# Patient Record
Sex: Female | Born: 1985 | Race: White | Hispanic: No | Marital: Married
Health system: Midwestern US, Community
[De-identification: ages and names within clinical notes are randomized; demographics above are authoritative.]

## PROBLEM LIST (undated history)

## (undated) DIAGNOSIS — F32A Depression, unspecified: Secondary | ICD-10-CM

## (undated) DIAGNOSIS — F329 Major depressive disorder, single episode, unspecified: Secondary | ICD-10-CM

## (undated) DIAGNOSIS — Z77120 Contact with and (suspected) exposure to mold (toxic): Secondary | ICD-10-CM

## (undated) DIAGNOSIS — F419 Anxiety disorder, unspecified: Secondary | ICD-10-CM

## (undated) DIAGNOSIS — Z124 Encounter for screening for malignant neoplasm of cervix: Secondary | ICD-10-CM

## (undated) DIAGNOSIS — Z0000 Encounter for general adult medical examination without abnormal findings: Secondary | ICD-10-CM

## (undated) HISTORY — DX: Anxiety disorder, unspecified: F41.9

## (undated) HISTORY — PX: WISDOM TOOTH EXTRACTION: SHX21

## (undated) HISTORY — DX: Contact with and (suspected) exposure to mold (toxic): Z77.120

## (undated) HISTORY — DX: Depression, unspecified: F32.A

## (undated) HISTORY — DX: Major depressive disorder, single episode, unspecified: F32.9

## (undated) HISTORY — PX: ACHILLES TENDON REPAIR: SUR1153

---

## 2007-09-09 HISTORY — PX: VULVECTOMY: SHX1086

## 2008-09-08 HISTORY — PX: BREAST FIBROADENOMA SURGERY: SHX580

## 2014-11-20 ENCOUNTER — Ambulatory Visit
Admit: 2014-11-20 | Discharge: 2014-11-20 | Payer: PRIVATE HEALTH INSURANCE | Attending: Family | Primary: Family Medicine

## 2014-11-20 ENCOUNTER — Encounter

## 2014-11-20 DIAGNOSIS — Z0000 Encounter for general adult medical examination without abnormal findings: Secondary | ICD-10-CM

## 2014-11-20 LAB — COMPREHENSIVE METABOLIC PANEL
ALT: 27 U/L (ref 13–61)
AST: 18 U/L (ref 0–31)
Albumin,Serum: 3.8 G/dL (ref 3.4–5.0)
Albumin/Globulin Ratio: 1.5 RATIO (ref 1.0–2.5)
Alkaline Phosphatase: 59 U/L (ref 45–117)
Anion Gap: 9 mmol/L (ref 6–16)
BUN/Creatinine Ratio: 10.5 RATIO (ref 6.0–20.0)
BUN: 9 mG/dL (ref 7–25)
CO2: 27 mmol/L (ref 21–31)
Calcium: 8.8 mG/dL (ref 8.2–10.5)
Chloride: 106 mmol/L (ref 98–109)
Creatinine: 0.86 mG/dL (ref 0.60–1.50)
GFR African American: >60 mL/min/{1.73_m2}
GFR Non-African American: >60 mL/min/{1.73_m2}
Globulin: 2.6 G/dL (ref 2.4–4.1)
Glucose: 84 mG/dL (ref 70–100)
Potassium: 3.6 mmol/L (ref 3.5–5.0)
Sodium: 142 mmol/L (ref 135–145)
Total Bilirubin: 0.5 mG/dL (ref 0.3–1.0)
Total Protein: 6.4 G/dL (ref 6.4–8.3)

## 2014-11-20 LAB — LIPID PANEL
Cholesterol, Total: 129 mG/dL (ref 100–199)
HDL: 47 mG/dL (ref 40–59)
LDL Calculated: 63 mG/dL (ref 0–129)
Triglycerides: 94 mG/dL (ref 40–149)

## 2014-11-20 LAB — TSH: TSH: 1.56 u[IU]/mL (ref 0.358–3.74)

## 2014-11-20 MED ORDER — PAROXETINE HCL 20 MG PO TABS
20 MG | ORAL_TABLET | Freq: Every morning | ORAL | Status: DC
Start: 2014-11-20 — End: 2015-11-21

## 2014-11-20 NOTE — Progress Notes (Signed)
Subjective:      Patient ID: Ellen Vaughn is a 29 y.o. female.    HPI  concerns today- rf paxil, needs letter for work, labs; congestion x3d  bps at home- none  ER visits in the last year- 1/16 cat bite-abx  last labs-2014; sugar nl and chol nl; liver enzyme high  No results found for: CHOL  No results found for: TRIG  No results found for: HDL  No results found for: LDLCALC, LDLCHOLESTEROL  No results found for: LABVLDL, VLDL  No results found for: CHOLHDLRATIO    No results found for: NA, K, CL, CO2, BUN, CREATININE, GLUCOSE, CALCIUM, PROT, LABALBU, BILITOT, ALKPHOS, AST, ALT, LABGLOM, GFRAA, AGRATIO, GLOB    last Pap-dr rice-did not like her; summer 2015    last eye dr appt- summer 2015  last dental appt- 10/15  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        No night sweats. No insomnia.   HENT: Negative for dental problem, hearing loss and trouble swallowing.         Pos seasonal allergic rhinitis symptoms.    Eyes:        No vision changes.   Respiratory: Negative for apnea, chest tightness, shortness of breath and wheezing.         No dyspnea upon exertion.   Cardiovascular: Negative for chest pain, palpitations and leg swelling.        No skips or pauses.   Gastrointestinal: Negative for nausea, vomiting, abdominal pain, diarrhea, constipation and blood in stool.        No heartburn.   Endocrine: Negative for polydipsia, polyphagia and polyuria.   Genitourinary: Negative for dysuria, urgency, frequency, hematuria and difficulty urinating.        No nocturia.   Musculoskeletal: Negative for myalgias and arthralgias.   Skin: Negative for rash and wound.        No lymph node swelling noted.   Neurological: Negative for seizures, syncope, weakness and headaches.        Dizziness at times; has orthostatic hypotension; wears  ted hose to help this   Hematological: Does not bruise/bleed easily.        No epistaxis.    Psychiatric/Behavioral:        No depression, anxiety, suicidal thoughts, or suicidal plans.       Objective:   Physical Exam   Constitutional: She appears well-developed and well-nourished.   HENT:   Head: Normocephalic and atraumatic.   Right Ear: Hearing, tympanic membrane, external ear and ear canal normal.   Left Ear: Hearing, tympanic membrane, external ear and ear canal normal.   Nose: Nose normal.   Mouth/Throat: Uvula is midline, oropharynx is clear and moist and mucous membranes are normal.   Eyes: Conjunctivae and lids are normal. Pupils are equal, round, and reactive to light. Lids are everted and swept, no foreign bodies found.   Fundoscopic exam:       The right eye shows red reflex.        The left eye shows red reflex.   EOMs intact.   Neck: Trachea normal, normal range of motion and phonation normal. Neck supple. Carotid bruit is not present. No thyroid mass and no thyromegaly present.   Cardiovascular:  Normal rate, regular rhythm, normal heart sounds and normal pulses.    No murmur heard.  Pulses:       Radial pulses are 2+ on the right side, and 2+ on the left side.   Pulmonary/Chest: Effort normal and breath sounds normal. She has no decreased breath sounds. She has no wheezes. She has no rhonchi. She has no rales.   Abdominal: Soft. Normal appearance, normal aorta and bowel sounds are normal. There is no splenomegaly or hepatomegaly. There is no tenderness.   BS x 4 quads.   Musculoskeletal: Normal range of motion.   Lymphadenopathy:     She has no cervical adenopathy.   Neurological: She is alert. She has normal strength and normal reflexes.   Reflex Scores:       Patellar reflexes are 2+ on the right side and 2+ on the left side.  CN II-XII intact.   Skin: Skin is warm and intact.   Psychiatric: She has a normal mood and affect. Her speech is normal and behavior is normal. Judgment and thought content  normal. Cognition and memory are normal.       Assessment:      1. Routine adult health maintenance  Comprehensive Metabolic Panel    Lipid Panel    TSH without Reflex             Plan:      Patient Instructions     Exercise-cardio 4-5d/wk each day  Diet-Breakfast-toast (my favorite Terrill [employer] Soft & Smooth)/bagel/English muffin-whole wheat flour as a 1st ingredient or cereal/oatmeal/granola bar-fiber 4g or more; protein like eggs or peanut butter is Ok  Lunch-protein, veg 1c  Dinner-protein, veg 1c  Fruit 2 a day  Dairy 2 a day-milk, soy milk, almond milk, cheese, yogurt, cottage cheese  Snacks-Protein-hard boiled egg, nuts (walnuts/almonds/pecans/pistachios 1/4c), hummus, beef/deer jerky; vegetable, fruit, dairy-milk(1%, skim, almond, soy)/cheese (not a lot of cheddar)/yogurt ([place] is best-my favorite Dannon Fruit on the [employer] Greek)/cottage cheese 2%; triscuits, popcorn have a lot of fiber; serving size  Water  Limit alcohol to 2 drinks per day (1 drink=12oz beer or 5oz wine or 1 1/2oz liquor)  Recommend flu vaccine in the fall  Next visit 1 yr  Well Visit, Ages 74 to 32: Care Instructions  Your Care Instructions  Physical exams can help you stay healthy. Your doctor has checked your overall health and may have suggested ways to take good care of yourself. He or she also may have recommended tests. At home, you can help prevent illness with healthy eating, regular exercise, and other steps.  Follow-up care is a key part of your treatment and safety. Be sure to make and go to all appointments, and call your doctor if you are having problems. It's also a good idea to know your test results and keep a list of the medicines you take.  How can you care for yourself at home?  ?? Reach and stay at a healthy weight. This will lower your risk for many problems, such as obesity, diabetes, heart disease, and high blood pressure.  ?? Get at least 30 minutes of physical activity on most days of the week. Walking is a good  choice. You also may want to do other activities, such as running, swimming, cycling, or playing tennis or team sports. Discuss any changes in your exercise program with your doctor.  ?? Do not smoke or allow others to smoke around you. If you need help quitting, talk to your  doctor about stop-smoking programs and medicines. These can increase your chances of quitting for good.  ?? Talk to your doctor about whether you have any risk factors for sexually transmitted infections (STIs). Having one sex partner (who does not have STIs and does not have sex with anyone else) is a good way to avoid these infections.  ?? Use birth control if you do not want to have children at this time. Talk with your doctor about the choices available and what might be best for you.  ?? Always wear sunscreen on exposed skin. Make sure the sunscreen blocks ultraviolet rays (both UVA and UVB) and has a sun protection factor (SPF) of at least 15. Use it every day, even when it is cloudy. Some doctors may recommend a higher SPF, such as 30.  ?? See a dentist one or two times a year for checkups and to have your teeth cleaned.  ?? Wear a seat belt in the car.  ?? Drink alcohol in moderation, if at all. That means no more than 2 drinks a day for men and 1 drink a day for women.  Follow your doctor's advice about when to have certain tests. These tests can spot problems early.  For everyone  ?? Cholesterol. Have the fat (cholesterol) in your blood tested after age 31. Your doctor will tell you how often to have this done based on your age, family history, or other things that can increase your risk for heart disease.  ?? Blood pressure. Experts suggest that healthy adults with normal blood pressure (119/79 mm Hg or below) have their blood pressure checked at least every 1 to 2 years. This can be done during a routine doctor visit. If you have slightly higher or high blood pressure, your doctor will suggest more frequent tests.  ?? Vision. Talk with your  doctor about how often to have a glaucoma test.  ?? Diabetes. Ask your doctor whether you should have tests for diabetes.  ?? Colon cancer. Have a test for colon cancer at age 31. You may have one of several tests. If you are younger than 4150, you may need a test earlier if you have any risk factors. Risk factors include whether you already had a precancerous polyp removed from your colon or whether your parent, brother, sister, or child has had colon cancer.  For women  ?? Breast exam and mammogram. Talk to your doctor about when you should have a clinical breast exam and a mammogram. Medical experts differ on whether and how often women under 50 should have these tests. Your doctor can help you decide what is right for you.  ?? Pap test and pelvic exam. Begin Pap tests at age 31. A Pap test is the best way to find cervical cancer. The test often is part of a pelvic exam. Ask how often to have this test.  ?? Tests for sexually transmitted infections (STIs). Ask whether you should have tests for STIs. You may be at risk if you have sex with more than one person, especially if your partners do not wear condoms.  For men  ?? Tests for sexually transmitted infections (STIs). Ask whether you should have tests for STIs. You may be at risk if you have sex with more than one person, especially if you do not wear a condom.  ?? Testicular cancer exam. Ask your doctor whether you should check your testicles regularly.  ?? Prostate exam. Talk to your doctor about whether you should  have a blood test (called a PSA test) for prostate cancer. Experts differ on whether and when men should have this test. Some experts suggest it if you are older than 32 and are African-American or have a father or brother who got prostate cancer when he was younger than 56.  When should you call for help?  Watch closely for changes in your health, and be sure to contact your doctor if you have any problems or symptoms that concern you.   Where can you learn  more?   Go to [email or web address] and sign in to your MyChart account. Enter P072 in the Search Health Information box to learn more about ???Well Visit, Ages 65 to 74: Care Instructions.???    If you do not have an account, please click on the ???Sign Up Now??? link.     ?? 2006-2015 [employer]. Care instructions adapted under license by [facility]. This care instruction is for use with your licensed healthcare professional. If you have questions about a medical condition or this instruction, always ask your healthcare professional. [employer], Inc[employer] disclaims any warranty or liability for your use of this information.  Content Version: 10.6.465758; Current as of: October 28, 2013

## 2014-11-20 NOTE — Patient Instructions (Addendum)
Exercise-cardio 4-5d/wk each day  Diet-Breakfast-toast (my favorite Terr[employer] Soft & Smooth)/bagel/English muffin-whole wheat flour as a 1st ingredient or cereal/oatmeal/granola bar-fiber 4g or more; protein like eggs or peanut butter is Ok  Lunch-protein, veg 1c  Dinner-protein, veg 1c  Fruit 2 a day  Dairy 2 a day-milk, soy milk, almond milk, cheese, yogurt, cottage cheese  Snacks-Protein-hard boiled egg, nuts (walnuts/almonds/pecans/pistachios 1/4c), hummus, beef/deer jerky; vegetable, fruit, dairy-milk(1%, skim, almond, soy)/cheese (not a lot of cheddar)/yogurt ([place] is best-my favorite Dannon Fruit on the [employer])/cottage cheese 2%; triscuits, popcorn have a lot of fiber; serving size  Water  Limit alcohol to 2 drinks per day (1 drink=12oz beer or 5oz wine or 1 1/2oz liquor)  Recommend flu vaccine in the fall  Next visit 1 yr  Well Visit, Ages 72 to 52: Care Instructions  Your Care Instructions  Physical exams can help you stay healthy. Your doctor has checked your overall health and may have suggested ways to take good care of yourself. He or she also may have recommended tests. At home, you can help prevent illness with healthy eating, regular exercise, and other steps.  Follow-up care is a key part of your treatment and safety. Be sure to make and go to all appointments, and call your doctor if you are having problems. It's also a good idea to know your test results and keep a list of the medicines you take.  How can you care for yourself at home?  ?? Reach and stay at a healthy weight. This will lower your risk for many problems, such as obesity, diabetes, heart disease, and high blood pressure.  ?? Get at least 30 minutes of physical activity on most days of the week. Walking is a good choice. You also may want to do other activities, such as running, swimming, cycling, or playing tennis or team sports. Discuss any changes in your exercise program with your doctor.  ?? Do not smoke or allow others  to smoke around you. If you need help quitting, talk to your doctor about stop-smoking programs and medicines. These can increase your chances of quitting for good.  ?? Talk to your doctor about whether you have any risk factors for sexually transmitted infections (STIs). Having one sex partner (who does not have STIs and does not have sex with anyone else) is a good way to avoid these infections.  ?? Use birth control if you do not want to have children at this time. Talk with your doctor about the choices available and what might be best for you.  ?? Always wear sunscreen on exposed skin. Make sure the sunscreen blocks ultraviolet rays (both UVA and UVB) and has a sun protection factor (SPF) of at least 15. Use it every day, even when it is cloudy. Some doctors may recommend a higher SPF, such as 30.  ?? See a dentist one or two times a year for checkups and to have your teeth cleaned.  ?? Wear a seat belt in the car.  ?? Drink alcohol in moderation, if at all. That means no more than 2 drinks a day for men and 1 drink a day for women.  Follow your doctor's advice about when to have certain tests. These tests can spot problems early.  For everyone  ?? Cholesterol. Have the fat (cholesterol) in your blood tested after age 20. Your doctor will tell you how often to have this done based on your age, family history, or other things  that can increase your risk for heart disease.  ?? Blood pressure. Experts suggest that healthy adults with normal blood pressure (119/79 mm Hg or below) have their blood pressure checked at least every 1 to 2 years. This can be done during a routine doctor visit. If you have slightly higher or high blood pressure, your doctor will suggest more frequent tests.  ?? Vision. Talk with your doctor about how often to have a glaucoma test.  ?? Diabetes. Ask your doctor whether you should have tests for diabetes.  ?? Colon cancer. Have a test for colon cancer at age 31. You may have one of several tests. If  you are younger than 6750, you may need a test earlier if you have any risk factors. Risk factors include whether you already had a precancerous polyp removed from your colon or whether your parent, brother, sister, or child has had colon cancer.  For women  ?? Breast exam and mammogram. Talk to your doctor about when you should have a clinical breast exam and a mammogram. Medical experts differ on whether and how often women under 50 should have these tests. Your doctor can help you decide what is right for you.  ?? Pap test and pelvic exam. Begin Pap tests at age 31. A Pap test is the best way to find cervical cancer. The test often is part of a pelvic exam. Ask how often to have this test.  ?? Tests for sexually transmitted infections (STIs). Ask whether you should have tests for STIs. You may be at risk if you have sex with more than one person, especially if your partners do not wear condoms.  For men  ?? Tests for sexually transmitted infections (STIs). Ask whether you should have tests for STIs. You may be at risk if you have sex with more than one person, especially if you do not wear a condom.  ?? Testicular cancer exam. Ask your doctor whether you should check your testicles regularly.  ?? Prostate exam. Talk to your doctor about whether you should have a blood test (called a PSA test) for prostate cancer. Experts differ on whether and when men should have this test. Some experts suggest it if you are older than 6045 and are African-American or have a father or brother who got prostate cancer when he was younger than 3465.  When should you call for help?  Watch closely for changes in your health, and be sure to contact your doctor if you have any problems or symptoms that concern you.   Where can you learn more?   Go to [email or web address] and sign in to your MyChart account. Enter P072 in the Search Health Information box to learn more about ???Well Visit, Ages 8318 to 2850: Care Instructions.???    If  you do not have an account, please click on the ???Sign Up Now??? link.     ?? 2006-2015 [employer], [employer]. Care instructions adapted under license by [facility]. This care instruction is for use with your licensed healthcare professional. If you have questions about a medical condition or this instruction, always ask your healthcare professional. [employer] disclaims any warranty or liability for your use of this information.  Content Version: 10.6.465758; Current as of: October 28, 2013

## 2015-11-21 MED ORDER — PAROXETINE HCL 20 MG PO TABS
20 MG | ORAL_TABLET | Freq: Every morning | ORAL | 4 refills | Status: DC
Start: 2015-11-21 — End: 2015-12-25

## 2015-11-21 NOTE — Telephone Encounter (Signed)
Pt advised rx sent

## 2015-11-21 NOTE — Telephone Encounter (Signed)
Pt called states her rx for Paroxetine expired yesterday and she thought it expired Friday 11/23/2015. Pt states she needs the expiration date changed so she can get this medication refilled.     Please advise.

## 2015-11-21 NOTE — Telephone Encounter (Signed)
Script sent

## 2015-12-25 ENCOUNTER — Ambulatory Visit: Admit: 2015-12-25 | Discharge: 2015-12-25 | Payer: BLUE CROSS/BLUE SHIELD | Attending: Family | Primary: Family Medicine

## 2015-12-25 ENCOUNTER — Telehealth

## 2015-12-25 DIAGNOSIS — Z0000 Encounter for general adult medical examination without abnormal findings: Secondary | ICD-10-CM

## 2015-12-25 LAB — CBC WITH AUTO DIFFERENTIAL
Absolute Baso #: 0.1 10*3/uL (ref 0.0–0.2)
Absolute Eos #: 0.1 10*3/uL (ref 0.0–0.5)
Absolute Lymph #: 2 10*3/uL (ref 1.0–4.3)
Absolute Mono #: 0.3 10*3/uL (ref 0.0–0.8)
Absolute Neut #: 1.5 10*3/uL — ABNORMAL LOW (ref 1.8–7.0)
Basophils: 2 %
Eosinophils: 3.4 %
Granulocytes %: 36.4 %
Hematocrit: 38.8 % (ref 35.0–47.0)
Hemoglobin: 12.8 g/dL (ref 11.7–16.0)
Lymphocyte %: 49.8 %
MCH: 28.7 pg (ref 26.0–34.0)
MCHC: 33 % (ref 32.0–36.0)
MCV: 87.1 fL (ref 79.0–98.0)
MPV: 8.4 fL (ref 7.4–10.4)
Monocytes: 8.4 %
Platelets: 217 10*3/uL (ref 140–440)
RBC: 4.46 10*6/uL (ref 3.80–5.20)
RDW: 14.5 % (ref 11.5–14.5)
WBC: 4.1 10*3/uL (ref 3.6–10.7)

## 2015-12-25 LAB — COMPREHENSIVE METABOLIC PANEL
ALT: 26 U/L (ref 12–78)
AST: 23 U/L (ref 15–37)
Albumin,Serum: 3.8 g/dL (ref 3.4–5.0)
Alkaline Phosphatase: 59 U/L (ref 45–117)
Anion Gap: 8 NA
BUN: 11 mg/dL (ref 7–25)
CO2: 28 mmol/L (ref 21–32)
Calcium: 8.8 mg/dL (ref 8.2–10.1)
Chloride: 104 mmol/L (ref 98–109)
Creatinine: 0.8 mg/dL (ref 0.55–1.40)
EGFR IF NonAfrican American: >60 mL/min (ref >60)
Glucose: 84 mg/dL (ref 70–100)
Potassium: 3.7 mmol/L (ref 3.5–5.1)
Sodium: 140 mmol/L (ref 135–145)
Total Bilirubin: 0.5 mg/dL (ref 0.2–1.0)
Total Protein: 6.8 g/dL (ref 6.4–8.2)
eGFR African American: >60 mL/min (ref >60)

## 2015-12-25 LAB — LIPID PANEL
Chol/HDL Ratio: 2 NA
Cholesterol: 155 mg/dL (ref <200)
HDL: 64 mg/dL — ABNORMAL HIGH (ref 40–59)
LDL Cholesterol: 74 mg/dL (ref <100)
Triglycerides: 85 mg/dL (ref <150)

## 2015-12-25 LAB — TSH: TSH: 1.65 uU/mL (ref 0.358–3.740)

## 2015-12-25 MED ORDER — PAROXETINE HCL 20 MG PO TABS
20 | ORAL_TABLET | Freq: Every morning | ORAL | 4 refills | Status: AC
Start: 2015-12-25 — End: ?

## 2015-12-25 NOTE — Telephone Encounter (Signed)
pls call dr rice to get the last pap result  Then send to eileen so when [facility] gets [facility]

## 2015-12-25 NOTE — Telephone Encounter (Signed)
Obtained Pap Smear report and scanned into Epic. Updated HM.

## 2015-12-25 NOTE — Patient Instructions (Addendum)
Exercise-cardio 4-5d/wk 30min each day  Diet-Breakfast-toast (my favorite [employer] Soft [employer] Smooth)/bagel/English muffin-whole wheat flour as a 1st ingredient or cereal/oatmeal/granola bar-fiber 4g or more; protein like eggs or peanut butter is Ok  Lunch-protein, veg 1c  Dinner-protein, veg 1c  Fruit 2 a day  Dairy 2 a day-milk, soy milk, almond milk, cheese, yogurt, cottage cheese  Snacks-Protein-hard boiled egg, nuts (walnuts/almonds/pecans/pistachios 1/4c), hummus, beef/deer jerky; vegetable, fruit, dairy-milk(1%, skim, almond, soy)/cheese (not a lot of cheddar)/yogurt (Greek is best-my favorite D[employer] Fruit on the Bottom Greek)/cottage cheese 2%; triscuits, popcorn have a lot of fiber; serving size  Water  Limit alcohol to 1 drink per day for women and 2 drinks per day for men (1 drink=12oz beer or 5oz wine or 1 1/2oz liquor)  Use sunscreen   Wear seatbelt  Eye dr every 1-2yrs  Dentist every 6-12 mon  Tetanus shot every 10yrs  Recommend flu vaccine in the fall  Appt in 1 year for physical     Well Visit, Ages 18 to 50: Care Instructions  Your Care Instructions  Physical exams can help you stay healthy. Your doctor has checked your overall health and may have suggested ways to take good care of yourself. He or she also may have recommended tests. At home, you can help prevent illness with healthy eating, regular exercise, and other steps.  Follow-up care is a key part of your treatment and safety. Be sure to make and go to all appointments, and call your doctor if you are having problems. It's also a good idea to know your test results and keep a list of the medicines you take.  How can you care for yourself at home?  ?? Reach and stay at a healthy weight. This will lower your risk for many problems, such as obesity, diabetes, heart disease, and high blood pressure.  ?? Get at least 30 minutes of physical activity on most days of the week. Walking is a good choice. You also may want to do other activities, such as  running, swimming, cycling, or playing tennis or team sports. Discuss any changes in your exercise program with your doctor.  ?? Do not smoke or allow others to smoke around you. If you need help quitting, talk to your doctor about stop-smoking programs and medicines. These can increase your chances of quitting for good.  ?? Talk to your doctor about whether you have any risk factors for sexually transmitted infections (STIs). Having one sex partner (who does not have STIs and does not have sex with anyone else) is a good way to avoid these infections.  ?? Use birth control if you do not want to have children at this time. Talk with your doctor about the choices available and what might be best for you.  ?? Protect your skin from too much sun. When you're outdoors from 10 a.m. to 4 p.m., stay in the shade or cover up with clothing and a hat with a wide brim. Wear sunglasses that block UV rays. Even when it's cloudy, put broad-spectrum sunscreen (SPF 30 or higher) on any exposed skin.  ?? See a dentist one or two times a year for checkups and to have your teeth cleaned.  ?? Wear a seat belt in the car.  ?? Drink alcohol in moderation, if at all. That means no more than 2 drinks a day for men and 1 drink a day for women.  Follow your doctor's advice about when to have certain tests. These   tests can spot problems early.  For everyone  ?? Cholesterol. Have the fat (cholesterol) in your blood tested after age 33. Your doctor will tell you how often to have this done based on your age, family history, or other things that can increase your risk for heart disease.  ?? Blood pressure. Have your blood pressure checked during a routine doctor visit. Your doctor will tell you how often to check your blood pressure based on your age, your blood pressure results, and other factors.  ?? Vision. Talk with your doctor about how often to have a glaucoma test.  ?? Diabetes. Ask your doctor whether you should have tests for diabetes.  ?? Colon  cancer. Have a test for colon cancer at age 66. You may have one of several tests. If you are younger than 61, you may need a test earlier if you have any risk factors. Risk factors include whether you already had a precancerous polyp removed from your colon or whether your parent, brother, sister, or child has had colon cancer.  For women  ?? Breast exam and mammogram. Talk to your doctor about when you should have a clinical breast exam and a mammogram. Medical experts differ on whether and how often women under 50 should have these tests. Your doctor can help you decide what is right for you.  ?? Pap test and pelvic exam. Begin Pap tests at age 84. A Pap test is the best way to find cervical cancer. The test often is part of a pelvic exam. Ask how often to have this test.  ?? Tests for sexually transmitted infections (STIs). Ask whether you should have tests for STIs. You may be at risk if you have sex with more than one person, especially if your partners do not wear condoms.  For men  ?? Tests for sexually transmitted infections (STIs). Ask whether you should have tests for STIs. You may be at risk if you have sex with more than one person, especially if you do not wear a condom.  ?? Testicular cancer exam. Ask your doctor whether you should check your testicles regularly.  ?? Prostate exam. Talk to your doctor about whether you should have a blood test (called a PSA test) for prostate cancer. Experts differ on whether and when men should have this test. Some experts suggest it if you are older than 53 and are African-American or have a father or brother who got prostate cancer when he was younger than 79.  When should you call for help?  Watch closely for changes in your health, and be sure to contact your doctor if you have any problems or symptoms that concern you.  Where can you learn more?  Go to [email or web address] and sign in to your MyChart account. Enter P072 in the Search Health  Information box to learn more about "Well Visit, Ages 49 to 53: Care Instructions."     If you do not have an account, please click on the "Sign Up Now" link.  Current as of: March 27, 2015  Content Version: 11.2  ?? 2006-2017 [employer]. Care instructions adapted under license by [facility]. If you have questions about a medical condition or this instruction, always ask your healthcare professional. [employer] disclaims any warranty or liability for your use of this information.       Learning About Living Patrecia Pace  What is a living will?  A living will is a legal form you use to write down the  kind of care you want at the end of your life. It is used by the health professionals who will treat you if you aren't able to decide for yourself.  If you put your wishes in writing, your loved ones and others will know what kind of care you want. They won't need to guess. This can ease your mind and be helpful to others.  A living will is not the same as an estate or property will. An estate will explains what you want to happen with your money and property after you die.  Is a living will a legal document?  A living will is a legal document. Each state has its own laws about living wills. If you move to another state, make sure that your living will is legal in the state where you now live. Or you might use a universal form that has been approved by many states. This kind of form can sometimes be completed and stored online. Your electronic copy will then be available wherever you have a connection to the Internet. In most cases, doctors will respect your wishes even if you have a form from a different state.  ?? You don't need an attorney to complete a living will. But legal advice can be helpful if your state's laws are unclear, your health history is complicated, or your family can't agree on what should be in your living will.  ?? You can change your living will at any time. Some people find  that their wishes about end-of-life care change as their health changes.  ?? In addition to making a living will, think about completing a medical power of attorney form. This form lets you name the person you want to make end-of-life treatment decisions for you (your "health care agent") if you're not able to. Many hospitals and nursing homes will give you the forms you need to complete a living will and a medical power of attorney.  ?? Your living will is used only if you can't make or communicate decisions for yourself anymore. If you become able to make decisions again, you can accept or refuse any treatment, no matter what you wrote in your living will.  ?? Your state may offer an online registry. This is a place where you can store your living will online so the doctors and nurses who need to treat you can find it right away.  What should you think about when creating a living will?  Talk about your end-of-life wishes with your family members and your doctor. Let them know what you want. That way the people making decisions for you won't be surprised by your choices.  Think about these questions as you make your living will:  ?? Do you know enough about life support methods that might be used? If not, talk to your doctor so you know what might be done if you can't breathe on your own, your heart stops, or you're unable to swallow.  ?? What things would you still want to be able to do after you receive life-support methods? Would you want to be able to walk? To speak? To eat on your own? To live without the help of machines?  ?? If you have a choice, where do you want to be cared for? In your home? At a hospital or nursing home?  ?? Do you want certain religious practices performed if you become very ill?  ?? If you have a choice at the end of your  life, where would you prefer to die? At home? In a hospital or nursing home? Somewhere else?  ?? Would you prefer to be buried or cremated?  ?? Do you want your organs to be  donated after you die?  What should you do with your living will?  ?? Make sure that your family members and your health care agent have copies of your living will.  ?? Give your doctor a copy of your living will to keep in your medical record. If you have more than one doctor, make sure that each one has a copy.  ?? You may want to put a copy of your living will where it can be easily found.  Where can you learn more?  Go to [email or web address] and sign in to your MyChart account. Enter [phone] in the Search Health Information box to learn more about "Learning About Living Patrecia PaceWills."     If you do not have an account, please click on the "Sign Up Now" link.  Current as of: November 01, 2014  Content Version: 11.2  ?? 2006-2017 [employer]. Care instructions adapted under license by [facility]. If you have questions about a medical condition or this instruction, always ask your healthcare professional. [employer] disclaims any warranty or liability for your use of this information.

## 2015-12-25 NOTE — Progress Notes (Signed)
Subjective:      Patient ID: Ellen Vaughn is a 30 y.o. female.    HPI  concerns today- none  bps at home- none  ER visits in the last year- none  last labs-  Lab Results   Component Value Date    CHOL 129 11/20/2014     Lab Results   Component Value Date    TRIG 94 11/20/2014     Lab Results   Component Value Date    HDL 47 11/20/2014     Lab Results   Component Value Date    LDLCALC 63 11/20/2014     No results found for: LABVLDL, VLDL  No results found for: [facility]    Lab Results   Component Value Date    NA 142 11/20/2014    K 3.6 11/20/2014    CL 106 11/20/2014    CO2 27 11/20/2014    BUN 9 11/20/2014    CREATININE 0.86 11/20/2014    GLUCOSE 84 11/20/2014    CALCIUM 8.8 11/20/2014    PROT 6.4 11/20/2014    LABALBU 3.8 11/20/2014    BILITOT 0.5 11/20/2014    ALKPHOS 59 11/20/2014    AST 18 11/20/2014    ALT 27 11/20/2014    LABGLOM >60 11/20/2014    GFRAA >60 11/20/2014    AGRATIO 1.5 11/20/2014    GLOB 2.6 11/20/2014       Special[place]    last Pap-1 1/2 yrs ago    last eye dr appt- summer 2016  last dental appt- 4/17  Exercise- daily-walk 2 mi a day and strengthening  Diet- eating more veg fruit; eat 5-6 small meals  Lost 8lbs d/t walking dog; pt denies trying to lose wt    Immunization History   Administered Date(s) Administered   ??? Tdap (Boostrix, Adacel) 11/19/2012       Review of Systems   Constitutional: Negative for appetite change, chills, fatigue, fever and unexpected weight change.        Well developed, well nourished. Does not appear ill. No distress. No night sweats. No insomnia.   HENT: Negative for congestion, dental problem, ear discharge, ear pain, hearing loss, nosebleeds, sore throat, tinnitus and trouble swallowing.         No seasonal allergic rhinitis symptoms.    Eyes: Negative for pain, discharge and visual disturbance.        No vision changes.   Respiratory: Negative for apnea, cough, chest tightness, shortness of breath and wheezing.         No dyspnea upon  exertion.   Cardiovascular: Negative for chest pain, palpitations and leg swelling.        No skips or pauses.   Gastrointestinal: Negative for abdominal distention, abdominal pain, blood in stool, constipation, diarrhea, nausea and vomiting.        No heartburn.   Endocrine: Negative for polydipsia, polyphagia and polyuria.   Genitourinary: Negative for difficulty urinating, dysuria, frequency, hematuria and urgency.        No nocturia.   Musculoskeletal: Negative for arthralgias and myalgias.   Skin: Negative for rash and wound.        No lymph node swelling noted.   Neurological: Positive for dizziness (wears ted hose to help with that). Negative for seizures, syncope, weakness, light-headedness, numbness and headaches.   Hematological: Does not bruise/bleed easily.        No epistaxis.    Psychiatric/Behavioral:        No  anxiety, suicidal thoughts, or suicidal plans. paxil working well for depression       Objective:   Physical Exam   Constitutional: She appears well-developed and well-nourished. She does not appear ill. No distress.   HENT:   Head: Normocephalic and atraumatic.   Right Ear: Hearing, tympanic membrane, external ear and ear canal normal.   Left Ear: Hearing, tympanic membrane, external ear and ear canal normal.   Nose: Nose normal.   Mouth/Throat: Uvula is midline, oropharynx is clear and moist and mucous membranes are normal.   Eyes: Conjunctivae, EOM and lids are normal. Pupils are equal, round, and reactive to light.   Fundoscopic exam:       The right eye shows red reflex.        The left eye shows red reflex.   Neck: Trachea normal, normal range of motion and phonation normal. Neck supple. Carotid bruit is not present. No thyroid mass and no thyromegaly present.   Cardiovascular: Normal rate, regular rhythm, S1 normal, S2 normal, normal heart sounds and normal pulses.  Exam reveals no gallop and no friction rub.    No murmur heard.  Pulses:       Radial pulses are 2+ on the right side, and  2+ on the left side.   Pulmonary/Chest: Effort normal and breath sounds normal. No accessory muscle usage. No respiratory distress. She has no decreased breath sounds. She has no wheezes. She has no rhonchi. She has no rales.   Abdominal: Soft. Normal appearance, normal aorta and bowel sounds are normal. There is no splenomegaly or hepatomegaly. There is no tenderness. There is no rigidity, no rebound and no guarding.   BS x 4 quads.   Musculoskeletal: Normal range of motion.   Lymphadenopathy:     She has no cervical adenopathy.   Neurological: She is alert. She has normal strength and normal reflexes. Coordination and gait normal.   Reflex Scores:       Patellar reflexes are 2+ on the right side and 2+ on the left side.  CN II-XII intact.   Skin: Skin is warm and intact. No rash noted.   Psychiatric: She has a normal mood and affect. Her speech is normal and behavior is normal. Judgment and thought content normal. Cognition and memory are normal.   Vitals reviewed.      Assessment:      1. Routine adult health maintenance  Comprehensive Metabolic Panel    Lipid Panel    TSH without Reflex    CBC Auto Differential           Plan:      Patient Instructions   Exercise-cardio 4-5d/wk each day  Diet-Breakfast-toast (my favorite Terrill Mohr Soft & Smooth)/bagel/English muffin-whole wheat flour as a 1st ingredient or cereal/oatmeal/granola bar-fiber 4g or more; protein like eggs or peanut butter is Ok  Lunch-protein, veg 1c  Dinner-protein, veg 1c  Fruit 2 a day  Dairy 2 a day-milk, soy milk, almond milk, cheese, yogurt, cottage cheese  Snacks-Protein-hard boiled egg, nuts (walnuts/almonds/pecans/pistachios 1/4c), hummus, beef/deer jerky; vegetable, fruit, dairy-milk(1%, skim, almond, soy)/cheese (not a lot of cheddar)/yogurt ([place] is best-my favorite Dannon Fruit on the [employer] Greek)/cottage cheese 2%; triscuits, popcorn have a lot of fiber; serving size  Water  Limit alcohol to 1 drink per day for women and 2  drinks per day for men (1 drink=12oz beer or 5oz wine or 1 1/2oz liquor)  Use sunscreen   Wear seatbelt  Eye dr every  1-40yrs  Dentist every 6-12 mon  Tetanus shot every 25yrs  Recommend flu vaccine in the fall  Appt in 1 year for physical     Well Visit, Ages 38 to 4: Care Instructions  Your Care Instructions  Physical exams can help you stay healthy. Your doctor has checked your overall health and may have suggested ways to take good care of yourself. He or she also may have recommended tests. At home, you can help prevent illness with healthy eating, regular exercise, and other steps.  Follow-up care is a key part of your treatment and safety. Be sure to make and go to all appointments, and call your doctor if you are having problems. It's also a good idea to know your test results and keep a list of the medicines you take.  How can you care for yourself at home?  ?? Reach and stay at a healthy weight. This will lower your risk for many problems, such as obesity, diabetes, heart disease, and high blood pressure.  ?? Get at least 30 minutes of physical activity on most days of the week. Walking is a good choice. You also may want to do other activities, such as running, swimming, cycling, or playing tennis or team sports. Discuss any changes in your exercise program with your doctor.  ?? Do not smoke or allow others to smoke around you. If you need help quitting, talk to your doctor about stop-smoking programs and medicines. These can increase your chances of quitting for good.  ?? Talk to your doctor about whether you have any risk factors for sexually transmitted infections (STIs). Having one sex partner (who does not have STIs and does not have sex with anyone else) is a good way to avoid these infections.  ?? Use birth control if you do not want to have children at this time. Talk with your doctor about the choices available and what might be best for you.  ?? Protect your skin from too much sun. When you're  outdoors from 10 a.m. to 4 p.m., stay in the shade or cover up with clothing and a hat with a wide brim. Wear sunglasses that block UV rays. Even when it's cloudy, put broad-spectrum sunscreen (SPF 30 or higher) on any exposed skin.  ?? See a dentist one or two times a year for checkups and to have your teeth cleaned.  ?? Wear a seat belt in the car.  ?? Drink alcohol in moderation, if at all. That means no more than 2 drinks a day for men and 1 drink a day for women.  Follow your doctor's advice about when to have certain tests. These tests can spot problems early.  For everyone  ?? Cholesterol. Have the fat (cholesterol) in your blood tested after age 24. Your doctor will tell you how often to have this done based on your age, family history, or other things that can increase your risk for heart disease.  ?? Blood pressure. Have your blood pressure checked during a routine doctor visit. Your doctor will tell you how often to check your blood pressure based on your age, your blood pressure results, and other factors.  ?? Vision. Talk with your doctor about how often to have a glaucoma test.  ?? Diabetes. Ask your doctor whether you should have tests for diabetes.  ?? Colon cancer. Have a test for colon cancer at age 75. You may have one of several tests. If you are younger than 84, you may  need a test earlier if you have any risk factors. Risk factors include whether you already had a precancerous polyp removed from your colon or whether your parent, brother, sister, or child has had colon cancer.  For women  ?? Breast exam and mammogram. Talk to your doctor about when you should have a clinical breast exam and a mammogram. Medical experts differ on whether and how often women under 50 should have these tests. Your doctor can help you decide what is right for you.  ?? Pap test and pelvic exam. Begin Pap tests at age 32. A Pap test is the best way to find cervical cancer. The test often is part of a pelvic exam. Ask how often  to have this test.  ?? Tests for sexually transmitted infections (STIs). Ask whether you should have tests for STIs. You may be at risk if you have sex with more than one person, especially if your partners do not wear condoms.  For men  ?? Tests for sexually transmitted infections (STIs). Ask whether you should have tests for STIs. You may be at risk if you have sex with more than one person, especially if you do not wear a condom.  ?? Testicular cancer exam. Ask your doctor whether you should check your testicles regularly.  ?? Prostate exam. Talk to your doctor about whether you should have a blood test (called a PSA test) for prostate cancer. Experts differ on whether and when men should have this test. Some experts suggest it if you are older than 6945 and are African-American or have a father or brother who got prostate cancer when he was younger than 6765.  When should you call for help?  Watch closely for changes in your health, and be sure to contact your doctor if you have any problems or symptoms that concern you.  Where can you learn more?  Go to [email or web address] and sign in to your MyChart account. Enter P072 in the Search Health Information box to learn more about "Well Visit, Ages 5318 to 6750: Care Instructions."     If you do not have an account, please click on the "Sign Up Now" link.  Current as of: March 27, 2015  Content Version: 11.2  ?? 2006-2017 [employer]. Care instructions adapted under license by [facility]. If you have questions about a medical condition or this instruction, always ask your healthcare professional. [employer], Inc[employer] disclaims any warranty or liability for your use of this information.       Learning About Living Ellen Vaughn  What is a living will?  A living will is a legal form you use to write down the kind of care you want at the end of your life. It is used by the health professionals who will treat you if you aren't able to decide for  yourself.  If you put your wishes in writing, your loved ones and others will know what kind of care you want. They won't need to guess. This can ease your mind and be helpful to others.  A living will is not the same as an estate or property will. An estate will explains what you want to happen with your money and property after you die.  Is a living will a legal document?  A living will is a legal document. Each state has its own laws about living wills. If you move to another state, make sure that your living will is legal in the state where you now live.  Or you might use a universal form that has been approved by many states. This kind of form can sometimes be completed and stored online. Your electronic copy will then be available wherever you have a connection to the Internet. In most cases, doctors will respect your wishes even if you have a form from a different state.  ?? You don't need an attorney to complete a living will. But legal advice can be helpful if your state's laws are unclear, your health history is complicated, or your family can't agree on what should be in your living will.  ?? You can change your living will at any time. Some people find that their wishes about end-of-life care change as their health changes.  ?? In addition to making a living will, think about completing a medical power of attorney form. This form lets you name the person you want to make end-of-life treatment decisions for you (your "health care agent") if you're not able to. Many hospitals and nursing homes will give you the forms you need to complete a living will and a medical power of attorney.  ?? Your living will is used only if you can't make or communicate decisions for yourself anymore. If you become able to make decisions again, you can accept or refuse any treatment, no matter what you wrote in your living will.  ?? Your state may offer an online registry. This is a place where you can store your living will online  so the doctors and nurses who need to treat you can find it right away.  What should you think about when creating a living will?  Talk about your end-of-life wishes with your family members and your doctor. Let them know what you want. That way the people making decisions for you won't be surprised by your choices.  Think about these questions as you make your living will:  ?? Do you know enough about life support methods that might be used? If not, talk to your doctor so you know what might be done if you can't breathe on your own, your heart stops, or you're unable to swallow.  ?? What things would you still want to be able to do after you receive life-support methods? Would you want to be able to walk? To speak? To eat on your own? To live without the help of machines?  ?? If you have a choice, where do you want to be cared for? In your home? At a hospital or nursing home?  ?? Do you want certain religious practices performed if you become very ill?  ?? If you have a choice at the end of your life, where would you prefer to die? At home? In a hospital or nursing home? Somewhere else?  ?? Would you prefer to be buried or cremated?  ?? Do you want your organs to be donated after you die?  What should you do with your living will?  ?? Make sure that your family members and your health care agent have copies of your living will.  ?? Give your doctor a copy of your living will to keep in your medical record. If you have more than one doctor, make sure that each one has a copy.  ?? You may want to put a copy of your living will where it can be easily found.  Where can you learn more?  Go to [email or web address] and sign in to your MyChart account. Enter [phone] in the Search Health Information  box to learn more about "Learning About Living Ellen Pace."     If you do not have an account, please click on the "Sign Up Now" link.  Current as of: November 01, 2014  Content Version: 11.2  ?? 2006-2017 [employer]  [employer]. Care instructions adapted under license by [facility]. If you have questions about a medical condition or this instruction, always ask your healthcare professional. [employer], Inc[employer] disclaims any warranty or liability for your use of this information.

## 2016-06-11 NOTE — Telephone Encounter (Signed)
Done.

## 2017-04-16 DIAGNOSIS — F419 Anxiety disorder, unspecified: Secondary | ICD-10-CM | POA: Insufficient documentation

## 2018-08-03 ENCOUNTER — Ambulatory Visit (INDEPENDENT_AMBULATORY_CARE_PROVIDER_SITE_OTHER): Payer: BLUE CROSS/BLUE SHIELD | Admitting: Advanced Practice Midwife

## 2018-08-03 ENCOUNTER — Encounter: Payer: Self-pay | Admitting: Advanced Practice Midwife

## 2018-08-03 VITALS — BP 115/62 | HR 68 | Resp 16 | Ht 68.0 in | Wt 136.0 lb

## 2018-08-03 DIAGNOSIS — Z1151 Encounter for screening for human papillomavirus (HPV): Secondary | ICD-10-CM

## 2018-08-03 DIAGNOSIS — Z124 Encounter for screening for malignant neoplasm of cervix: Secondary | ICD-10-CM

## 2018-08-03 DIAGNOSIS — Z01419 Encounter for gynecological examination (general) (routine) without abnormal findings: Secondary | ICD-10-CM | POA: Diagnosis not present

## 2018-08-03 NOTE — Progress Notes (Signed)
Subjective:     Jody Schroeder is a 32 y.o. female here for a routine exam.  Current complaints: none.  Personal health questionnaire reviewed: yes. Pt desires annual Pap.   Gynecologic History Patient's last menstrual period was 07/20/2018. Contraception: none Last Pap: 2018. Results were: normal Last mammogram: n/a.   Obstetric History OB History  Gravida Para Term Preterm AB Living  0 0 0 0 0 0  SAB TAB Ectopic Multiple Live Births  0 0 0 0 0     The following portions of the patient's history were reviewed and updated as appropriate: allergies, current medications, past family history, past medical history, past social history, past surgical history and problem list.  Review of Systems A comprehensive review of systems was negative.    Objective:   BP 115/62   Pulse 68   Resp 16   Ht 5\' 8"  (1.727 m)   Wt 61.7 kg   LMP 07/20/2018   BMI 20.68 kg/m    VS reviewed, nursing note reviewed,  Constitutional: well developed, well nourished, no distress HEENT: normocephalic CV: normal rate Pulm/chest wall: normal effort Breast Exam:  right breast normal without mass, skin or nipple changes or axillary nodes, left breast normal without mass, skin or nipple changes or axillary nodes Abdomen: soft Neuro: alert and oriented x 3 Skin: warm, dry Psych: affect normal Pelvic exam: Cervix pink, visually closed, without lesion, scant white creamy discharge, vaginal walls and external genitalia normal Bimanual exam: Cervix 0/long/high, firm, anterior, neg CMT, uterus nontender, nonenlarged, adnexa without tenderness, enlargement, or mass   Assessment/Plan   1. Well woman exam with routine gynecological exam  - Cytology - PAP( [place])    Education reviewed: self breast exams and weight bearing exercise. Follow up in: 1 year.     Sharen CounterLisa Leftwich-Kirby, CNM 1:26 PM

## 2018-08-04 LAB — CYTOLOGY - PAP
DIAGNOSIS: NEGATIVE
HPV: NOT DETECTED

## 2019-05-18 DIAGNOSIS — Z6820 Body mass index (BMI) 20.0-20.9, adult: Secondary | ICD-10-CM | POA: Diagnosis not present

## 2019-05-18 DIAGNOSIS — Z0000 Encounter for general adult medical examination without abnormal findings: Secondary | ICD-10-CM | POA: Diagnosis not present

## 2019-06-14 ENCOUNTER — Other Ambulatory Visit: Payer: Self-pay

## 2019-06-14 ENCOUNTER — Encounter: Payer: Self-pay | Admitting: Advanced Practice Midwife

## 2019-06-14 ENCOUNTER — Ambulatory Visit (INDEPENDENT_AMBULATORY_CARE_PROVIDER_SITE_OTHER): Payer: BC Managed Care – PPO | Admitting: Advanced Practice Midwife

## 2019-06-14 VITALS — BP 107/71 | HR 77 | Resp 16 | Ht 68.0 in | Wt 138.0 lb

## 2019-06-14 DIAGNOSIS — Z124 Encounter for screening for malignant neoplasm of cervix: Secondary | ICD-10-CM

## 2019-06-14 DIAGNOSIS — Z1151 Encounter for screening for human papillomavirus (HPV): Secondary | ICD-10-CM

## 2019-06-14 DIAGNOSIS — Z01419 Encounter for gynecological examination (general) (routine) without abnormal findings: Secondary | ICD-10-CM | POA: Diagnosis not present

## 2019-06-14 DIAGNOSIS — F525 Vaginismus not due to a substance or known physiological condition: Secondary | ICD-10-CM | POA: Insufficient documentation

## 2019-06-14 NOTE — Progress Notes (Signed)
Subjective:     Jody Schroeder is a 33 y.o. female here for a routine exam.  Current complaints: pt is not having intercourse due to pain.  She had vulvectomy due to pain 10+ years ago and has significant pain with intercourse now. She prefers to do Pap every year. Paps have been normal. Personal health questionnaire reviewed: yes.   Do you have a primary care provider? yes How many times per week do you exercise? 3+ Do you feel safe at home? Yes Has anyone hit, slapped, or kicked you recently? No Do you feel sad, tired, or upset most days or are you mostly happy with life? Mostly happy  Gynecologic History Patient's last menstrual period was 06/02/2019. Contraception: none Last Pap: 2019. Results were: normal Last mammogram: n/a.   Obstetric History OB History  Gravida Para Term Preterm AB Living  0 0 0 0 0 0  SAB TAB Ectopic Multiple Live Births  0 0 0 0 0     The following portions of the patient's history were reviewed and updated as appropriate: allergies, current medications, past family history, past medical history, past social history, past surgical history and problem list.  Review of Systems Pertinent items noted in HPI and remainder of comprehensive ROS otherwise negative.    Objective:  BP 107/71   Pulse 77   Resp 16   Ht 5\' 8"  (1.727 m)   Wt 138 lb (62.6 kg)   LMP 06/02/2019   BMI 20.98 kg/m    VS reviewed, nursing note reviewed,  Constitutional: well developed, well nourished, no distress HEENT: normocephalic CV: normal rate Pulm/chest wall: normal effort Breast Exam:  right breast normal without mass, skin or nipple changes or axillary nodes, left breast normal without mass, skin or nipple changes or axillary nodes Abdomen: soft Neuro: alert and oriented x 3 Skin: warm, dry Psych: affect normal Pelvic exam: Cervix pink, visually closed, without lesion, scant white creamy discharge, vaginal walls and external genitalia normal.  Pt with pain on insertion  of the speculum, resolved with relaxation, change to smaller Pederson speculum. Bimanual exam: Cervix 0/long/high, firm, anterior, neg CMT, uterus nontender, nonenlarged, adnexa without tenderness, enlargement, or mass      Assessment/Plan:   1. Well woman exam with routine gynecological exam --Doing well overall.  Not sexually active with her husband due to vaginal pain, this is a chronic issue (see vaginismus below).  2. Encounter for screening for cervical cancer  - Cytology - PAP( [place])  3. 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  Discussed options with pt and she would like to research and try some pelvic floor exercises at home.  She used to work as Forensic scientist.  Encouraged pt to follow up with [place] for more treatment as needed.      Social/emotional concerns:  None, likely mild anxiety but managed well.  Contraception: none  Follow up in: 1 year or as needed.   Fatima Blank, CNM 10:06 AM

## 2019-06-21 LAB — CYTOLOGY - PAP
Diagnosis: NEGATIVE
High risk HPV: NEGATIVE

## 2019-09-12 DIAGNOSIS — E7800 Pure hypercholesterolemia, unspecified: Secondary | ICD-10-CM | POA: Diagnosis not present

## 2019-09-12 DIAGNOSIS — F419 Anxiety disorder, unspecified: Secondary | ICD-10-CM | POA: Diagnosis not present

## 2019-09-12 DIAGNOSIS — Z0000 Encounter for general adult medical examination without abnormal findings: Secondary | ICD-10-CM | POA: Diagnosis not present

## 2019-12-19 DIAGNOSIS — D2272 Melanocytic nevi of left lower limb, including hip: Secondary | ICD-10-CM | POA: Diagnosis not present

## 2019-12-19 DIAGNOSIS — D225 Melanocytic nevi of trunk: Secondary | ICD-10-CM | POA: Diagnosis not present

## 2019-12-19 DIAGNOSIS — L700 Acne vulgaris: Secondary | ICD-10-CM | POA: Diagnosis not present

## 2019-12-19 DIAGNOSIS — Z1283 Encounter for screening for malignant neoplasm of skin: Secondary | ICD-10-CM | POA: Diagnosis not present

## 2020-02-13 DIAGNOSIS — N390 Urinary tract infection, site not specified: Secondary | ICD-10-CM | POA: Diagnosis not present

## 2020-02-13 DIAGNOSIS — R5383 Other fatigue: Secondary | ICD-10-CM | POA: Diagnosis not present

## 2020-02-13 DIAGNOSIS — R531 Weakness: Secondary | ICD-10-CM | POA: Diagnosis not present

## 2020-02-13 DIAGNOSIS — R42 Dizziness and giddiness: Secondary | ICD-10-CM | POA: Diagnosis not present

## 2020-02-13 DIAGNOSIS — R319 Hematuria, unspecified: Secondary | ICD-10-CM | POA: Diagnosis not present

## 2020-02-28 ENCOUNTER — Emergency Department (INDEPENDENT_AMBULATORY_CARE_PROVIDER_SITE_OTHER): Payer: BC Managed Care – PPO

## 2020-02-28 ENCOUNTER — Other Ambulatory Visit: Payer: Self-pay

## 2020-02-28 ENCOUNTER — Emergency Department (INDEPENDENT_AMBULATORY_CARE_PROVIDER_SITE_OTHER)
Admission: RE | Admit: 2020-02-28 | Discharge: 2020-02-28 | Disposition: A | Discharge status: Home / Self Care | Payer: BC Managed Care – PPO | Source: Ambulatory Visit | Attending: Family Medicine | Admitting: Family Medicine

## 2020-02-28 VITALS — BP 119/88 | HR 81 | Temp 98.5°F | Ht 68.0 in | Wt 133.0 lb

## 2020-02-28 DIAGNOSIS — R1013 Epigastric pain: Secondary | ICD-10-CM | POA: Diagnosis not present

## 2020-02-28 DIAGNOSIS — R0781 Pleurodynia: Secondary | ICD-10-CM

## 2020-02-28 DIAGNOSIS — M940 Chondrocostal junction syndrome [Tietze]: Secondary | ICD-10-CM

## 2020-02-28 LAB — POCT URINALYSIS DIP (MANUAL ENTRY)
Bilirubin, UA: NEGATIVE
Blood, UA: NEGATIVE
Glucose, UA: NEGATIVE mg/dL
Ketones, POC UA: NEGATIVE mg/dL
Leukocytes, UA: NEGATIVE
Nitrite, UA: NEGATIVE
Protein Ur, POC: NEGATIVE mg/dL
Spec Grav, UA: 1.015 (ref 1.010–1.025)
Urobilinogen, UA: 0.2 E.U./dL
pH, UA: 6 (ref 5.0–8.0)

## 2020-02-28 NOTE — Discharge Instructions (Addendum)
Apply ice pack for 20 to 30 minutes, 3 to 4 times daily  Continue until pain and swelling decrease.  May take Ibuprofen 200mg, 4 tabs every 8 hours with food.  

## 2020-02-28 NOTE — ED Triage Notes (Signed)
Epigastric pain x 2 weeks lifted heavy rocks on May 8th has had uti since which has not resolved with Cipro.

## 2020-02-28 NOTE — ED Provider Notes (Signed)
[facility]    CSN: [id number] Arrival date & time: 02/28/20  0950      History   Chief Complaint Chief Complaint  Patient presents with  . Abdominal Pain    HPI Jody Schroeder is a 34 y.o. female.   Patient complains of persistent epigastric and right flank pain after lifting very heavy stones in May.  On 02/13/20 she was treated for a UTI with Cipro.  After culture results returned she was switched to amoxicillin for 10 days for more appropriate bacterial coverage.  She is concerned that her pain may represent persistent UTI.  She feels well otherwise without urinary symptoms or fevers, chills, and sweats.   The history is provided by the patient.    Past Medical History:  Diagnosis Date  . Anxiety   . Depression     Patient Active Problem List   Diagnosis Date Noted  . 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 ACHILLES TENDON REPAIR    . BREAST FIBROADENOMA SURGERY Left 2010  . VULVECTOMY  2009  . WISDOM TOOTH EXTRACTION      OB History    Gravida  0   Para  0   Term  0   Preterm  0   AB  0   Living  0     SAB  0   TAB  0   Ectopic  0   Multiple  0   Live Births  0            Home Medications    Prior to Admission medications   Medication Sig Start Date End Date Taking? Authorizing Provider  buPROPion (WELLBUTRIN XL) 150 MG 24 hr tablet Take by mouth. 06/03/19   [provider]  Multiple Vitamin (MULTIVITAMIN) tablet Take 1 tablet by mouth daily.    [provider]    Family History Family History  Problem Relation Age of Onset  . COPD Paternal Grandfather   . Diabetes Paternal Grandmother   . Hypertension Paternal Grandmother   . Thyroid cancer Maternal Grandfather   . Heart disease Maternal Grandfather   . Hypertension Father   . Diabetes Father   . Depression Mother   . Fibroids Mother   . Ovarian cysts Sister      Social History Social History   Tobacco Use  . Smoking status: Never Smoker  . Smokeless tobacco: Never Used  Vaping Use  . Vaping Use: Never used  Substance Use Topics  . Alcohol use: Never  . Drug use: Never     Allergies   Patient has no known allergies.   Review of Systems Review of Systems  Constitutional: Negative for chills, diaphoresis, fatigue and fever.  HENT: Negative.   Eyes: Negative.   Respiratory: Negative for cough, chest tightness, shortness of breath, wheezing and stridor.   Cardiovascular: Positive for chest pain. Negative for leg swelling.  Gastrointestinal: Negative.   Genitourinary: Positive for flank pain. Negative for dysuria, frequency, hematuria and urgency.  Musculoskeletal:       Persistent right lower/lateral rib pain  Skin: Negative for rash.  Neurological: Negative.      Physical Exam Triage Vital Signs ED Triage Vitals  Enc Vitals Group     BP 02/28/20 1037 119/88     Pulse Rate 02/28/20 1037 81     Resp --      Temp 02/28/20 1037 98.5 F (36.9  C)     Temp Source 02/28/20 1037 Oral     SpO2 02/28/20 1037 98 %     Weight 02/28/20 1039 133 lb (60.3 kg)     Height 02/28/20 1039 5\' 8"  (1.727 m)     Head Circumference --      Peak Flow --      Pain Score 02/28/20 1038 7     Pain Loc --      Pain Edu? --      Excl. in GC? --    No data found.  Updated Vital Signs BP 119/88 (BP Location: Right Arm)   Pulse 81   Temp 98.5 F (36.9 C) (Oral)   Ht 5\' 8"  (1.727 m)   Wt 60.3 kg   LMP 02/05/2020 (Approximate)   SpO2 98%   BMI 20.22 kg/m   Visual Acuity Right Eye Distance:   Left Eye Distance:   Bilateral Distance:    Right Eye Near:   Left Eye Near:    Bilateral Near:     Physical Exam Vitals and nursing note reviewed.  Constitutional:      General: She is not in acute distress. HENT:     Head: Normocephalic.     Right Ear: External ear normal.     Left Ear: External ear normal.     Mouth/Throat:     Mouth:  Mucous membranes are moist.  Eyes:     Conjunctiva/sclera: Conjunctivae normal.     Pupils: Pupils are equal, round, and reactive to light.  Cardiovascular:     Heart sounds: Normal heart sounds.  Pulmonary:     Breath sounds: Normal breath sounds. No wheezing, rhonchi or rales.       Comments: Tenderness to palpation over the right posterior/inferior ribs as noted on diagram.  Chest:     Chest wall: Tenderness present.       Comments: There is tenderness to palpation over the Xiphoid process extending along right inferior ribs and costal margin as noted on diagram.  Abdominal:     Hernia: No hernia is present.  Musculoskeletal:        General: No tenderness.     Cervical back: Neck supple.     Right lower leg: No edema.     Left lower leg: No edema.  Lymphadenopathy:     Cervical: No cervical adenopathy.  Skin:    General: Skin is warm and dry.     Findings: No rash.  Neurological:     Mental Status: She is alert and oriented to person, place, and time.      UC Treatments / Results  Labs (all labs ordered are listed, but only abnormal results are displayed) Labs Reviewed  URINE CULTURE    POCT urinalysis dipstick (new) Order: Status:  Final result Visible to patient:  No (inaccessible in MyChart) Next appt:  None Dx:  Abdominal pain, epigastric  0 Result Notes  Ref Range & Units 4 d ago  Color, UA yellow yellow   Clarity, UA clear clear   Glucose, UA negative mg/dL negative   Bilirubin, UA negative negative   Ketones, POC UA negative mg/dL negative   Spec Grav, UA 1.010 - 1.025 1.015   Blood, UA negative negative   pH, UA 5.0 - 8.0 6.0   Protein Ur, POC negative mg/dL negative   Urobilinogen, UA 0.2 or 1.0 E.U./dL 0.2   Nitrite, UA Negative Negative   Leukocytes, UA Negative Negative   Resulting Agency  [facility]         EKG   Radiology CLINICAL DATA:  Right-sided rib pain following heavy lifting 2 months ago, initial  encounter  EXAM: RIGHT RIBS AND CHEST - 3+ VIEW  COMPARISON:  None.  FINDINGS: Cardiac shadow is within normal limits. The lungs are clear bilaterally. No pneumothorax or effusion is seen. No rib fractures are noted.  IMPRESSION: No acute rib fracture is noted.   Electronically Signed   By: Alcide Clever M.D.   On: 02/28/2020 12:44  Procedures Procedures (including critical care time)  Medications Ordered in UC Medications - No data to display  Initial Impression / Assessment and Plan / UC Course  I have reviewed the triage vital signs and the nursing notes.  Pertinent labs & imaging results that were available during my care of the patient were reviewed by me and considered in my medical decision making (see chart for details).    Patient reassurance:  No evidence persistent UTI; will send urine culture at patient's request. If patient's rib pain does not improve with conservative treatment, consider follow-up with Dr. Charyl Bigger [facility]) for consideration of local corticosteroid injection.   Final Clinical Impressions(s) / UC Diagnoses   Final diagnoses:  Abdominal pain, epigastric  Costochondritis     Discharge Instructions     Apply ice pack for 20 to 30 minutes, 3 to 4 times daily  Continue until pain and swelling decrease.  May take Ibuprofen 200mg , 4 tabs every 8 hours with food.      ED Prescriptions    None        , MD 03/03/20 [phone]

## 2020-03-01 LAB — URINE CULTURE
MICRO NUMBER:: 10622395
Result:: NO GROWTH
SPECIMEN QUALITY:: ADEQUATE

## 2020-04-30 ENCOUNTER — Telehealth: Payer: Self-pay | Admitting: Emergency Medicine

## 2020-04-30 ENCOUNTER — Other Ambulatory Visit: Payer: Self-pay

## 2020-04-30 ENCOUNTER — Emergency Department (INDEPENDENT_AMBULATORY_CARE_PROVIDER_SITE_OTHER)
Admission: RE | Admit: 2020-04-30 | Discharge: 2020-04-30 | Disposition: A | Discharge status: Home / Self Care | Payer: BC Managed Care – PPO | Source: Ambulatory Visit

## 2020-04-30 ENCOUNTER — Other Ambulatory Visit (HOSPITAL_COMMUNITY)
Admission: RE | Admit: 2020-04-30 | Discharge: 2020-04-30 | Disposition: A | Discharge status: Home / Self Care | Payer: BC Managed Care – PPO | Source: Ambulatory Visit | Attending: Family Medicine | Admitting: Family Medicine

## 2020-04-30 VITALS — BP 111/75 | HR 76 | Temp 98.2°F | Ht 68.0 in | Wt 133.0 lb

## 2020-04-30 DIAGNOSIS — N898 Other specified noninflammatory disorders of vagina: Secondary | ICD-10-CM

## 2020-04-30 DIAGNOSIS — Z8619 Personal history of other infectious and parasitic diseases: Secondary | ICD-10-CM | POA: Diagnosis not present

## 2020-04-30 DIAGNOSIS — Z9189 Other specified personal risk factors, not elsewhere classified: Secondary | ICD-10-CM | POA: Diagnosis not present

## 2020-04-30 DIAGNOSIS — R6883 Chills (without fever): Secondary | ICD-10-CM

## 2020-04-30 DIAGNOSIS — B49 Unspecified mycosis: Secondary | ICD-10-CM | POA: Diagnosis not present

## 2020-04-30 DIAGNOSIS — R52 Pain, unspecified: Secondary | ICD-10-CM

## 2020-04-30 DIAGNOSIS — R531 Weakness: Secondary | ICD-10-CM | POA: Diagnosis not present

## 2020-04-30 DIAGNOSIS — K146 Glossodynia: Secondary | ICD-10-CM

## 2020-04-30 DIAGNOSIS — Z8742 Personal history of other diseases of the female genital tract: Secondary | ICD-10-CM | POA: Insufficient documentation

## 2020-04-30 MED ORDER — FLUCONAZOLE 200 MG PO TABS: 200.0000 mg | ORAL_TABLET | Freq: Every day | ORAL | 1 refills | Status: AC

## 2020-04-30 NOTE — Telephone Encounter (Signed)
Call to [facility] to confirm tubes to be sent to lab to R/O throat fungal infection . Lab rep confirmed - throat culture in white culture tube could be used. Order throat culture (code 394 does not work, but per [employer] okay to use Strep throat culture to test for fungal infection.) Red top &  white culture top sent to [facility]. Provider updated

## 2020-04-30 NOTE — ED Triage Notes (Addendum)
Thrush, body aches, hot and cold, fatigue, weakness x 4 days all stemming from ABT for UTI in May Unvaccinated

## 2020-04-30 NOTE — Discharge Instructions (Addendum)
You may take 500mg  acetaminophen every 4-6 hours or in combination with ibuprofen 400-600mg  every 6-8 hours as needed for pain, inflammation, and fever.  Be sure to well hydrated with clear liquids and get at least 8 hours of sleep at night, preferably more while sick.    Call to schedule an appointment with a primary care provider to establish care for recheck of ongoing symptoms and further evaluation of recurrent yeast infections.    Call 911 or have someone drive you to the hospital if symptoms worsening- dizziness/passing out, chest pain, trouble breathing, unable to keep down fluids, or other new concerning symptoms develop.   Due to concern for possibly having Covid-19, it is advised that you self-isolate at home until test results come back, usually 2-3 days.  If positive, it is recommended you stay isolated for at least 10 days after symptom onset and 24 hours after last fever without taking medication (whichever is longer).  If you MUST go out, please wear a mask at all times, limit contact with others.   If your test is negative, you still have plenty of time to get the Covid vaccine. It is recommended you schedule an appointment to get your vaccine once you get over this current illness.  Please ask your primary care provider about any questions/concerns related to the vaccine.

## 2020-04-30 NOTE — ED Provider Notes (Signed)
[facility]    CSN: [id number] Arrival date & time: 04/30/20  0858      History   Chief Complaint Chief Complaint  Patient presents with  . Thrush    HPI Jody Schroeder is a 34 y.o. female.   HPI  Jody Schroeder is a 33 y.o. female presenting to UC with c/o body aches, hot and cold chills, fatigue and generalized weakness the last few days but states she is most concerned about recurrent oral thrush and yeast infections since being prescribed several rounds of antibiotics in June 2021 for a UTI.  Pt's last dose of Diflucan was in July, which helped only temporarily.  Pt concerned about continued recurrent tongue irritation and white film on her tongue.  Denies hx of diabetes, HIV, or other known autoimmune disease.  Denies use of oral steroids or recent use of antibiotics.  Pt had a PCP in June but has since been seeing a holistic provider and did Telemedicine in July for the last round of diflucan. Denies URI symptoms.  Pt has not received Covid-19 vaccine.     Past Medical History:  Diagnosis Date  . Anxiety   . Depression     Patient Active Problem List   Diagnosis Date Noted  . 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 ACHILLES TENDON REPAIR    . BREAST FIBROADENOMA SURGERY Left 2010  . VULVECTOMY  2009  . WISDOM TOOTH EXTRACTION      OB History    Gravida  0   Para  0   Term  0   Preterm  0   AB  0   Living  0     SAB  0   TAB  0   Ectopic  0   Multiple  0   Live Births  0            Home Medications    Prior to Admission medications   Medication Sig Start Date End Date Taking? Authorizing Provider  buPROPion (WELLBUTRIN XL) 150 MG 24 hr tablet Take by mouth. 06/03/19   [provider]  Multiple Vitamin (MULTIVITAMIN) tablet Take 1 tablet by mouth daily.    [provider]    Family History Family History  Problem Relation Age of  Onset  . COPD Paternal Grandfather   . Diabetes Paternal Grandmother   . Hypertension Paternal Grandmother   . Thyroid cancer Maternal Grandfather   . Heart disease Maternal Grandfather   . Hypertension Father   . Diabetes Father   . Depression Mother   . Fibroids Mother   . Ovarian cysts Sister     Social History Social History   Tobacco Use  . Smoking status: Never Smoker  . Smokeless tobacco: Never Used  Vaping Use  . Vaping Use: Never used  Substance Use Topics  . Alcohol use: Never  . Drug use: Never     Allergies   Patient has no known allergies.   Review of Systems Review of Systems  Constitutional: Positive for chills, fatigue and fever (subjective).  HENT: Positive for dental problem (tongue pain/irritation/film). Negative for congestion, ear pain, sore throat, trouble swallowing and voice change.   Respiratory: Negative for cough and shortness of breath.   Cardiovascular: Negative for chest pain and palpitations.  Gastrointestinal: Negative for abdominal pain, diarrhea, nausea and vomiting.  Musculoskeletal: Negative for arthralgias, back pain and myalgias.  Skin:  Negative for rash.  Neurological: Positive for weakness (generalized). Negative for dizziness, syncope, light-headedness, numbness and headaches.  All other systems reviewed and are negative.    Physical Exam Triage Vital Signs ED Triage Vitals  Enc Vitals Group     BP 04/30/20 0917 111/75     Pulse Rate 04/30/20 0917 76     Resp --      Temp 04/30/20 0917 98.2 F (36.8 C)     Temp Source 04/30/20 0917 Oral     SpO2 04/30/20 0917 99 %     Weight 04/30/20 0919 133 lb (60.3 kg)     Height 04/30/20 0919 5\' 8"  (1.727 m)     Head Circumference --      Peak Flow --      Pain Score 04/30/20 0918 2     Pain Loc --      Pain Edu? --      Excl. in GC? --    No data found.  Updated Vital Signs BP 111/75 (BP Location: Right Arm)   Pulse 76   Temp 98.2 F (36.8 C) (Oral)   Ht 5\' 8"  (1.727  m)   Wt 133 lb (60.3 kg)   LMP 04/23/2020 (Approximate)   SpO2 99%   BMI 20.22 kg/m   Visual Acuity Right Eye Distance:   Left Eye Distance:   Bilateral Distance:    Right Eye Near:   Left Eye Near:    Bilateral Near:     Physical Exam Vitals and nursing note reviewed.  Constitutional:      General: She is not in acute distress.    Appearance: Normal appearance. She is well-developed. She is not ill-appearing, toxic-appearing or diaphoretic.  HENT:     Head: Normocephalic and atraumatic.     Right Ear: Tympanic membrane and ear canal normal.     Left Ear: Tympanic membrane and ear canal normal.     Nose: Nose normal.     Right Sinus: No maxillary sinus tenderness or frontal sinus tenderness.     Left Sinus: No maxillary sinus tenderness or frontal sinus tenderness.     Mouth/Throat:     Lips: Pink.     Mouth: Mucous membranes are moist. No oral lesions.     Tongue: No lesions.     Pharynx: Oropharynx is clear. Uvula midline. No pharyngeal swelling, oropharyngeal exudate, posterior oropharyngeal erythema or uvula swelling.     Tonsils: No tonsillar exudate or tonsillar abscesses.  Cardiovascular:     Rate and Rhythm: Normal rate and regular rhythm.  Pulmonary:     Effort: Pulmonary effort is normal. No respiratory distress.     Breath sounds: Normal breath sounds. No stridor. No wheezing, rhonchi or rales.  Musculoskeletal:        General: Normal range of motion.     Cervical back: Normal range of motion and neck supple. No tenderness.  Lymphadenopathy:     Cervical: No cervical adenopathy.  Skin:    General: Skin is warm and dry.  Neurological:     Mental Status: She is alert and oriented to person, place, and time.  Psychiatric:        Behavior: Behavior normal.      UC Treatments / Results  Labs (all labs ordered are listed, but only abnormal results are displayed) Labs Reviewed  NOVEL CORONAVIRUS, NAA   Narrative:    Performed at:  [address], [place], [address]  [place] Lab Director:  Jolene Schimke MD, Phone:  [phone]  SARS-COV-2, NAA 2 DAY TAT   Narrative:    Performed at:  [address] [place] [address] [place], [place]  [id number] Lab Director: Jolene Schimke MD, Phone:  [phone]  FUNGAL STAIN  KOH PREP  CERVICOVAGINAL ANCILLARY ONLY    EKG   Radiology No results found.  Procedures Procedures (including critical care time)  Medications Ordered in UC Medications - No data to display  Initial Impression / Assessment and Plan / UC Course  I have reviewed the triage vital signs and the nursing notes.  Pertinent labs & imaging results that were available during my care of the patient were reviewed by me and considered in my medical decision making (see chart for details).     Pt c/o recurrent fungal infections. No obvious lesions c/w thrush on today's exam Fungal swab sent to lab as well as vaginal swab for yeast.  Pt started on diflucan while test pending.  Systemic symptoms of chills, fatigue weakness during pandemic and unvaccinated, concern for Covid.   Encouraged to reestablish are with a PCP for further evaluation and treatment of recurrent symptoms AVS given  Final Clinical Impressions(s) / UC Diagnoses   Final diagnoses:  Body aches  Chills (without fever)  Weakness  At increased risk of exposure to COVID-19 virus  Tongue pain  Vaginal irritation     Discharge Instructions     You may take 500mg  acetaminophen every 4-6 hours or in combination with ibuprofen 400-600mg  every 6-8 hours as needed for pain, inflammation, and fever.  Be sure to well hydrated with clear liquids and get at least 8 hours of sleep at night, preferably more while sick.    Call to schedule an appointment with a primary care provider to establish care for recheck of ongoing symptoms and further evaluation of recurrent yeast infections.    Call 911 or have someone drive you to  the hospital if symptoms worsening- dizziness/passing out, chest pain, trouble breathing, unable to keep down fluids, or other new concerning symptoms develop.   Due to concern for possibly having Covid-19, it is advised that you self-isolate at home until test results come back, usually 2-3 days.  If positive, it is recommended you stay isolated for at least 10 days after symptom onset and 24 hours after last fever without taking medication (whichever is longer).  If you MUST go out, please wear a mask at all times, limit contact with others.   If your test is negative, you still have plenty of time to get the Covid vaccine. It is recommended you schedule an appointment to get your vaccine once you get over this current illness.  Please ask your primary care provider about any questions/concerns related to the vaccine.      ED Prescriptions    Medication Sig Dispense Auth. Provider   fluconazole (DIFLUCAN) 200 MG tablet Take 1 tablet (200 mg total) by mouth daily for 1 day. 1 tablet , PA-C     PDMP not reviewed this encounter.   Lurene Shadow, Lurene Shadow 05/02/20 2133

## 2020-05-01 LAB — CERVICOVAGINAL ANCILLARY ONLY
Bacterial Vaginitis (gardnerella): NEGATIVE
Candida Glabrata: NEGATIVE
Candida Vaginitis: NEGATIVE
Comment: NEGATIVE
Comment: NEGATIVE
Comment: NEGATIVE

## 2020-05-02 LAB — FUNGAL STAIN
FUNGAL SMEAR:: NONE SEEN
MICRO NUMBER:: 10861889
SPECIMEN QUALITY:: ADEQUATE

## 2020-05-02 LAB — SARS-COV-2, NAA 2 DAY TAT

## 2020-05-02 LAB — CULTURE, GROUP A STREP
MICRO NUMBER:: 10861876
SPECIMEN QUALITY:: ADEQUATE

## 2020-05-02 LAB — NOVEL CORONAVIRUS, NAA: SARS-CoV-2, NAA: NOT DETECTED

## 2020-06-19 ENCOUNTER — Ambulatory Visit: Payer: BC Managed Care – PPO | Admitting: Advanced Practice Midwife

## 2020-07-03 ENCOUNTER — Encounter: Payer: Self-pay | Admitting: Advanced Practice Midwife

## 2020-07-03 ENCOUNTER — Ambulatory Visit (INDEPENDENT_AMBULATORY_CARE_PROVIDER_SITE_OTHER): Payer: BC Managed Care – PPO | Admitting: Advanced Practice Midwife

## 2020-07-03 ENCOUNTER — Other Ambulatory Visit (HOSPITAL_COMMUNITY)
Admission: RE | Admit: 2020-07-03 | Discharge: 2020-07-03 | Disposition: A | Discharge status: Home / Self Care | Payer: BC Managed Care – PPO | Source: Ambulatory Visit | Attending: Advanced Practice Midwife | Admitting: Advanced Practice Midwife

## 2020-07-03 ENCOUNTER — Other Ambulatory Visit: Payer: Self-pay

## 2020-07-03 VITALS — BP 127/77 | HR 72 | Ht 68.0 in | Wt 131.0 lb

## 2020-07-03 DIAGNOSIS — Z803 Family history of malignant neoplasm of breast: Secondary | ICD-10-CM | POA: Diagnosis not present

## 2020-07-03 DIAGNOSIS — N760 Acute vaginitis: Secondary | ICD-10-CM | POA: Diagnosis not present

## 2020-07-03 DIAGNOSIS — Z01419 Encounter for gynecological examination (general) (routine) without abnormal findings: Secondary | ICD-10-CM | POA: Insufficient documentation

## 2020-07-03 DIAGNOSIS — B370 Candidal stomatitis: Secondary | ICD-10-CM

## 2020-07-03 MED ORDER — FLUCONAZOLE 100 MG PO TABS: 100.0000 mg | ORAL_TABLET | Freq: Every day | ORAL | 0 refills | Status: AC

## 2020-07-03 NOTE — Progress Notes (Signed)
Subjective:     Jody Schroeder is a 34 y.o. female here at [facility] for a routine exam.  Current complaints: recurrent yeast vaginitis and oral candidiasis.  Personal health questionnaire reviewed: yes.  Do you have a primary care provider? yes Do you feel safe at home? yes      [facility] in [facility] Total Score 0       Risk factors for chronic health problems: Smoking:No Alchohol/how much:None Pt BMI: Body mass index is 19.92 kg/m.   Gynecologic History Patient's last menstrual period was 06/15/2020. Contraception: none Last Pap: 2020. Results were: normal Last mammogram: n/a.   Obstetric History OB History  Gravida Para Term Preterm AB Living  0 0 0 0 0 0  SAB TAB Ectopic Multiple Live Births  0 0 0 0 0     The following portions of the patient's history were reviewed and updated as appropriate: allergies, current medications, past family history, past medical history, past social history, past surgical history and problem list.  Review of Systems Pertinent items noted in HPI and remainder of comprehensive ROS otherwise negative.    Objective:   BP 127/77   Pulse 72   Ht 5\' 8"  (1.727 m)   Wt 131 lb (59.4 kg)   LMP 06/15/2020   BMI 19.92 kg/m  VS reviewed, nursing note reviewed,  Constitutional: well developed, well nourished, no distress HEENT: normocephalic CV: normal rate Pulm/chest wall: normal effort Breast Exam:   performed: right breast normal with fibrocystic breast changes throughout, no skin or nipple changes or axillary nodes, left breast normal with fibrocystic changes throughout, no skin or nipple changes or axillary nodes Abdomen: soft Neuro: alert and oriented x 3 Skin: warm, dry Psych: affect normal Pelvic exam: Performed: Cervix pink, visually closed, without lesion, scant white creamy discharge, vaginal walls and external genitalia normal with tiny cyst immediately below  urethral opening palpated by patient and CNM, nontender      Assessment/Plan:   1. Well woman exam --Pt prefers Pap annually. Discussed very low 5 year risk of cervical cancer with normal pap, negative HRHPV. With shared decision making, pt prefers annual Pap. --Pt with recurrent oral and vaginal yeast symptoms x 4-5 months, see below. - Cytology - PAP( [facility])  2. Oropharyngeal candidiasis --Pt with UTI 02/2020, initially treated with Macrobid, when UTI symptoms recurred, was started on Cipro but switched to amoxicillin 10 day course after culture resulted. --Pt started having both oral and vaginal yeast symptoms and was treated with Diflucan 2-3 times with resolution but return of symptoms. --She is currently taking probiotic, changing her diet, and using coconut oil vaginally.   --Continue current course, discussed using boric acid for pH balance PRN  --If symptoms do not resolve, pt can try longer course of lower dose of Diflucan if desired. - fluconazole (DIFLUCAN) 100 MG tablet; Take 1 tablet (100 mg total) by mouth daily for 14 days.  Dispense: 14 tablet; Refill: 0  3. Recurrent vaginitis --See above --May be vaginitis/skin sensitivity following multiple changes in therapy.   --Pt to continue current preventative course, may add Diflucan 14 day course if needed, and follow up in office if symptoms persist.    4. Family history of breast cancer --Pt mother diagnosed with breast cancer at age 55, Stage 0, lumpectomy/radiation treatment this year.   --Breast exam wnl today, reassurance provided. Recommend mammogram at age 35.    Follow up in:  1 year or as needed.   Sharen Counter, CNM 10:31 AM

## 2020-07-03 NOTE — Progress Notes (Signed)
Last pap 06/14/2019- negative

## 2020-07-04 LAB — CYTOLOGY - PAP
Adequacy: ABSENT
Comment: NEGATIVE
Diagnosis: NEGATIVE
High risk HPV: NEGATIVE

## 2020-09-11 ENCOUNTER — Ambulatory Visit: Payer: BC Managed Care – PPO | Admitting: Family Medicine

## 2020-10-03 ENCOUNTER — Ambulatory Visit: Payer: BC Managed Care – PPO | Admitting: Medical-Surgical

## 2020-10-03 ENCOUNTER — Ambulatory Visit: Payer: BC Managed Care – PPO | Admitting: Family Medicine

## 2020-10-03 ENCOUNTER — Other Ambulatory Visit: Payer: Self-pay

## 2020-10-03 DIAGNOSIS — Z79899 Other long term (current) drug therapy: Secondary | ICD-10-CM

## 2020-10-04 DIAGNOSIS — Z79899 Other long term (current) drug therapy: Secondary | ICD-10-CM | POA: Diagnosis not present

## 2020-10-04 LAB — COMPLETE METABOLIC PANEL WITH GFR
AG Ratio: 1.8 (calc) (ref 1.0–2.5)
ALT: 19 U/L (ref 6–29)
AST: 16 U/L (ref 10–30)
Albumin: 4.1 g/dL (ref 3.6–5.1)
Alkaline phosphatase (APISO): 63 U/L (ref 31–125)
BUN: 14 mg/dL (ref 7–25)
CO2: 27 mmol/L (ref 20–32)
Calcium: 9 mg/dL (ref 8.6–10.2)
Chloride: 103 mmol/L (ref 98–110)
Creat: 0.69 mg/dL (ref 0.50–1.10)
GFR, Est African American: 130 mL/min/{1.73_m2} (ref >=60)
GFR, Est Non African American: 112 mL/min/{1.73_m2} (ref >=60)
Globulin: 2.3 g/dL (calc) (ref 1.9–3.7)
Glucose, Bld: 77 mg/dL (ref 65–99)
Potassium: 4 mmol/L (ref 3.5–5.3)
Sodium: 137 mmol/L (ref 135–146)
Total Bilirubin: 0.5 mg/dL (ref 0.2–1.2)
Total Protein: 6.4 g/dL (ref 6.1–8.1)

## 2020-10-04 LAB — CBC WITH DIFFERENTIAL/PLATELET
Absolute Monocytes: 348 cells/uL (ref 200–950)
Basophils Absolute: 60 cells/uL (ref 0–200)
Basophils Relative: 1.5 %
Eosinophils Absolute: 120 cells/uL (ref 15–500)
Eosinophils Relative: 3 %
HCT: 39.8 % (ref 35.0–45.0)
Hemoglobin: 13.4 g/dL (ref 11.7–15.5)
Lymphs Abs: 1800 cells/uL (ref 850–3900)
MCH: 30.7 pg (ref 27.0–33.0)
MCHC: 33.7 g/dL (ref 32.0–36.0)
MCV: 91.3 fL (ref 80.0–100.0)
MPV: 11.1 fL (ref 7.5–12.5)
Monocytes Relative: 8.7 %
Neutro Abs: 1672 cells/uL (ref 1500–7800)
Neutrophils Relative %: 41.8 %
Platelets: 212 10*3/uL (ref 140–400)
RBC: 4.36 10*6/uL (ref 3.80–5.10)
RDW: 12 % (ref 11.0–15.0)
Total Lymphocyte: 45 %
WBC: 4 10*3/uL (ref 3.8–10.8)

## 2020-10-04 NOTE — Progress Notes (Signed)
New Patient Office Visit  Subjective:  Patient ID: Jody Schroeder, female    DOB: [birth year]  Age: 35 y.o. MRN: [id number]  CC:  Chief Complaint  Patient presents with  . Establish Care    HPI Jody Schroeder presents to establish care. She is self-employed with a past background in physical therapy. She has been dealing with recurrent vaginal yeast infections for several months. Notes this started when she had urinary tract infection symptoms. She was treated with Macrobid for 7 days but had no relief in symptoms. After that, she was reevaluated and put on Cipro for 4 days. Her culture resulted with group B strep so her antibiotic was changed to Amoxicillin. It was on the Amoxicillin that she noted symptoms of VVC. She has been using boric acid suppositories which helped but the infection keeps returning. Notes that the yeast is worse with stress and heat. Having periods of flushing and chills but no documented fevers. Has even noticed her tongue appears white and glossy at times, especially if she eats too many carbs. Durig all of these issues, she developed rib pain that interfered with her normal activities. She has done trigger point releases and made some significant changes to her diet/lifestyle. She now eats an anti-inflammatory diet with all organic, clean foods. No junk for for the past month. Has definitely noted an improvement in all of her symptoms although she still has mild peri-sternal rib tenderness rated 2/10.   Previously took Wellbutrin but decided to discontinue it on her own when she started having difficulty sleeping. Since stopping this and changing her diet, she is sleeping well and feels really good overall.   Past Medical History:  Diagnosis Date  . Anxiety   . Depression     Past Surgical History:  Procedure Laterality Date  . ACHILLES TENDON REPAIR    . BREAST FIBROADENOMA SURGERY Left 2010  . VULVECTOMY  2009  . WISDOM TOOTH EXTRACTION      Family History  Problem  Relation Age of Onset  . COPD Paternal Grandfather   . Diabetes Paternal Grandmother   . Hypertension Paternal Grandmother   . Thyroid cancer Maternal Grandfather   . Heart disease Maternal Grandfather   . Hypertension Father   . Diabetes Father   . Depression Mother   . Fibroids Mother   . Ovarian cysts Sister     Social History   Socioeconomic History  . Marital status: Married    Spouse name: Not on file  . Number of children: Not on file  . Years of education: Not on file  . Highest education level: Not on file  Occupational History  . Not on file  Tobacco Use  . Smoking status: Never Smoker  . Smokeless tobacco: Never Used  Vaping Use  . Vaping Use: Never used  Substance and Sexual Activity  . Alcohol use: Never  . Drug use: Never  . Sexual activity: Yes    Partners: Male    Comment: condom  Other Topics Concern  . Not on file  Social History Narrative  . Not on file   Social Determinants of Health   Financial Resource Strain: Not on file  Food Insecurity: Not on file  Transportation Needs: Not on file  Physical Activity: Not on file  Stress: Not on file  Social Connections: Not on file  Intimate Partner Violence: Not At Risk  . Fear of Current or Ex-Partner: No  . Emotionally Abused: No  . Physically Abused: No  .  Sexually Abused: No    ROS Review of Systems  Constitutional: Negative for chills, fatigue, fever and unexpected weight change.  Respiratory: Negative for cough, chest tightness, shortness of breath and wheezing.   Cardiovascular: Negative for chest pain, palpitations and leg swelling.  Genitourinary: Negative for dysuria, frequency, hematuria and urgency.  Neurological: Negative for dizziness, light-headedness and headaches.  Psychiatric/Behavioral: Negative for dysphoric mood, self-injury, sleep disturbance and suicidal ideas. The patient is not nervous/anxious.     Objective:   Today's Vitals: BP 110/76   Pulse 69   Temp 98.5 F  (36.9 C)   Ht 5' 8.25" (1.734 m)   Wt 133 lb 6.4 oz (60.5 kg)   LMP 09/27/2020   SpO2 100%   BMI 20.14 kg/m   Physical Exam Vitals reviewed.  Constitutional:      General: She is not in acute distress.    Appearance: Normal appearance.  HENT:     Head: Normocephalic and atraumatic.  Cardiovascular:     Rate and Rhythm: Normal rate and regular rhythm.     Pulses: Normal pulses.     Heart sounds: Normal heart sounds. No murmur heard. No friction rub. No gallop.   Pulmonary:     Effort: Pulmonary effort is normal. No respiratory distress.     Breath sounds: Normal breath sounds. No wheezing.  Skin:    General: Skin is warm and dry.  Neurological:     Mental Status: She is alert and oriented to person, place, and time.  Psychiatric:        Mood and Affect: Mood normal.        Behavior: Behavior normal.        Thought Content: Thought content normal.        Judgment: Judgment normal.     Assessment & Plan:   1. Encounter to establish care Reviewed available information and discussed care concerns with patient. She is up to date on preventative care at this point.   2. Recurrent vaginitis Discussed causes of recurrent vaginitis. Her symptoms have resolved as of our appointment today. If they return, she may benefit from having a SureSwab for vaginitis done as this will tell us if there is yeast present and narrow the species so we can tailor treatment appropriately.   3. Preventative health care CBC and CMP results reviewed. Patient reports her insurance will cover TSH and Lipids under preventative care so we will try to add these on to the blood in the lab. If unable to add on, she will come by one day soon and have them drawn while fasting.   Outpatient Encounter Medications as of 10/05/2020  Medication Sig  . Boric Acid Vaginal 600 MG SUPP Place 1 suppository vaginally daily.  . Charcoal Activated (ACTIVATED CHARCOAL PO) Take by mouth at bedtime as needed.  . Multiple  Vitamins-Minerals (MULTIPLE VITAMINS/WOMENS) tablet Take 1 tablet by mouth daily.  . Omega-3 Fatty Acids (FISH OIL PO) Take 1 capsule by mouth daily.  . Probiotic Product (PROBIOTIC PO) Take 1 tablet by mouth daily.  Marland Kitchen UNABLE TO FIND Med Name: Spirulina 3000 mg, 3 tablets daily  . [DISCONTINUED] buPROPion (WELLBUTRIN XL) 150 MG 24 hr tablet Take by mouth.  . [DISCONTINUED] Multiple Vitamin (MULTIVITAMIN) tablet Take 1 tablet by mouth daily. (Patient not taking: Reported on 07/03/2020)   No facility-administered encounter medications on file as of 10/05/2020.    Follow-up: Return if symptoms worsen or fail to improve.   Thayer Ohm, DNP, APRN,  FNP-BC [facility] and Sports Medicine

## 2020-10-05 ENCOUNTER — Ambulatory Visit (INDEPENDENT_AMBULATORY_CARE_PROVIDER_SITE_OTHER): Payer: BC Managed Care – PPO | Admitting: Medical-Surgical

## 2020-10-05 ENCOUNTER — Other Ambulatory Visit: Payer: Self-pay

## 2020-10-05 ENCOUNTER — Encounter: Payer: Self-pay | Admitting: Medical-Surgical

## 2020-10-05 VITALS — BP 110/76 | HR 69 | Temp 98.5°F | Ht 68.25 in | Wt 133.4 lb

## 2020-10-05 DIAGNOSIS — N760 Acute vaginitis: Secondary | ICD-10-CM | POA: Diagnosis not present

## 2020-10-05 DIAGNOSIS — Z0000 Encounter for general adult medical examination without abnormal findings: Secondary | ICD-10-CM | POA: Diagnosis not present

## 2020-10-05 DIAGNOSIS — Z7689 Persons encountering health services in other specified circumstances: Secondary | ICD-10-CM | POA: Diagnosis not present

## 2020-10-23 ENCOUNTER — Other Ambulatory Visit: Payer: Self-pay

## 2020-10-23 DIAGNOSIS — Z0000 Encounter for general adult medical examination without abnormal findings: Secondary | ICD-10-CM

## 2020-10-24 DIAGNOSIS — Z0000 Encounter for general adult medical examination without abnormal findings: Secondary | ICD-10-CM | POA: Diagnosis not present

## 2020-10-25 LAB — LIPID PANEL
Cholesterol: 145 mg/dL (ref <200)
HDL: 74 mg/dL (ref >=50)
LDL Cholesterol (Calc): 61 mg/dL (calc)
Non-HDL Cholesterol (Calc): 71 mg/dL (calc) (ref <130)
Total CHOL/HDL Ratio: 2 (calc) (ref <5.0)
Triglycerides: 34 mg/dL (ref <150)

## 2020-10-25 LAB — TSH: TSH: 1.93 mIU/L

## 2020-12-28 ENCOUNTER — Ambulatory Visit (INDEPENDENT_AMBULATORY_CARE_PROVIDER_SITE_OTHER): Payer: BC Managed Care – PPO | Admitting: Medical-Surgical

## 2020-12-28 ENCOUNTER — Other Ambulatory Visit: Payer: Self-pay

## 2020-12-28 ENCOUNTER — Encounter: Payer: Self-pay | Admitting: Medical-Surgical

## 2020-12-28 VITALS — BP 106/68 | HR 64 | Temp 99.2°F | Ht 68.25 in | Wt 135.3 lb

## 2020-12-28 DIAGNOSIS — R1010 Upper abdominal pain, unspecified: Secondary | ICD-10-CM | POA: Diagnosis not present

## 2020-12-28 DIAGNOSIS — R0781 Pleurodynia: Secondary | ICD-10-CM

## 2020-12-28 DIAGNOSIS — Z0000 Encounter for general adult medical examination without abnormal findings: Secondary | ICD-10-CM | POA: Diagnosis not present

## 2020-12-28 NOTE — Progress Notes (Signed)
Subjective:    CC: upper abdominal pain  HPI: Pleasant 35 year old female presenting today for ration of lower rib/upper abdominal pain.  We discussed this in January when she established but she is still struggling with discomfort.  She did switch to an anti-inflammatory diet which does help some with her symptoms.  Notes that she did eat a piece of cake over the weekend and she could definitely tell the pain was so much worse.  Notes the pain is located midline but does radiate bilaterally as well as down into the lower abdomen.  Even had an episode where the pain wrapped around to her back.  She is currently doing visits to a chiropractor for costochondritis and she reports he recommended she do an abdominal ultrasound  Would like to have updated blood work as well as a comprehensive ultrasound for further evaluation.  She is having difficulty dealing with the pain as regular activities such as bending, lifting, wearing a bra, and holding her cat are all intolerable.  I reviewed the past medical history, family history, social history, surgical history, and allergies today and no changes were needed.  Please see the problem list section below in epic for further details.  Past Medical History: Past Medical History:  Diagnosis Date  . Anxiety   . Depression    Past Surgical History: Past Surgical History:  Procedure Laterality Date  . ACHILLES TENDON REPAIR    . BREAST FIBROADENOMA SURGERY Left 2010  . VULVECTOMY  2009  . WISDOM TOOTH EXTRACTION     Social History: Social History   Socioeconomic History  . Marital status: Married    Spouse name: Not on file  . Number of children: Not on file  . Years of education: Not on file  . Highest education level: Not on file  Occupational History  . Not on file  Tobacco Use  . Smoking status: Never Smoker  . Smokeless tobacco: Never Used  Vaping Use  . Vaping Use: Never used  Substance and Sexual Activity  . Alcohol use: Never  .  Drug use: Never  . Sexual activity: Yes    Partners: Male    Comment: condom  Other Topics Concern  . Not on file  Social History Narrative  . Not on file   Social Determinants of Health   Financial Resource Strain: Not on file  Food Insecurity: Not on file  Transportation Needs: Not on file  Physical Activity: Not on file  Stress: Not on file  Social Connections: Not on file   Family History: Family History  Problem Relation Age of Onset  . COPD Paternal Grandfather   . Diabetes Paternal Grandmother   . Hypertension Paternal Grandmother   . Thyroid cancer Maternal Grandfather   . Heart disease Maternal Grandfather   . Hypertension Father   . Diabetes Father   . Depression Mother   . Fibroids Mother   . Ovarian cysts Sister    Allergies: Allergies  Allergen Reactions  . Bupropion Other (See Comments)    Difficulty sleeping   Medications: See med rec.  Review of Systems: See HPI for pertinent positives and negatives.   Objective:    General: Well Developed, well nourished, and in no acute distress.  Neuro: Alert and oriented x3.  HEENT: Normocephalic, atraumatic.  Skin: Warm and dry. Cardiac: Regular rate and rhythm, no murmurs rubs or gallops, no lower extremity edema.  Respiratory: Clear to auscultation bilaterally. Not using accessory muscles, speaking in full sentences.  Impression  and Recommendations:    1. Upper abdominal pain 2. Rib pain We will go ahead and get ultrasound of the abdomen.  Checking labs and including amylase, lipase, ESR, and CRP. - US Abdomen Complete; Future - Amylase - Lipase - Sed Rate (ESR) - C-reactive protein  3. Preventative health care Checking CBC with differential, CMP, and vitamin D. - CBC with Differential - COMPLETE METABOLIC PANEL WITH GFR - Vitamin D 1,25 dihydroxy  Return for follow up pending labs and imaging results. ___________________________________________ Clearnce Sorrel, DNP, APRN, FNP-BC Primary Care  and Sports Medicine [place]

## 2020-12-29 LAB — CBC WITH DIFFERENTIAL/PLATELET
Absolute Monocytes: 543 cells/uL (ref 200–950)
Basophils Absolute: 49 cells/uL (ref 0–200)
Basophils Relative: 0.8 %
Eosinophils Absolute: 189 cells/uL (ref 15–500)
Eosinophils Relative: 3.1 %
HCT: 40.8 % (ref 35.0–45.0)
Hemoglobin: 13.2 g/dL (ref 11.7–15.5)
Lymphs Abs: 1373 cells/uL (ref 850–3900)
MCH: 29.9 pg (ref 27.0–33.0)
MCHC: 32.4 g/dL (ref 32.0–36.0)
MCV: 92.5 fL (ref 80.0–100.0)
MPV: 11.3 fL (ref 7.5–12.5)
Monocytes Relative: 8.9 %
Neutro Abs: 3947 cells/uL (ref 1500–7800)
Neutrophils Relative %: 64.7 %
Platelets: 205 10*3/uL (ref 140–400)
RBC: 4.41 10*6/uL (ref 3.80–5.10)
RDW: 12.2 % (ref 11.0–15.0)
Total Lymphocyte: 22.5 %
WBC: 6.1 10*3/uL (ref 3.8–10.8)

## 2020-12-29 LAB — SEDIMENTATION RATE: Sed Rate: 2 mm/h (ref 0–20)

## 2020-12-29 LAB — COMPLETE METABOLIC PANEL WITH GFR
AG Ratio: 1.7 (calc) (ref 1.0–2.5)
ALT: 19 U/L (ref 6–29)
AST: 19 U/L (ref 10–30)
Albumin: 4.1 g/dL (ref 3.6–5.1)
Alkaline phosphatase (APISO): 68 U/L (ref 31–125)
BUN: 15 mg/dL (ref 7–25)
CO2: 30 mmol/L (ref 20–32)
Calcium: 8.9 mg/dL (ref 8.6–10.2)
Chloride: 104 mmol/L (ref 98–110)
Creat: 0.66 mg/dL (ref 0.50–1.10)
GFR, Est African American: 131 mL/min/{1.73_m2} (ref >=60)
GFR, Est Non African American: 113 mL/min/{1.73_m2} (ref >=60)
Globulin: 2.4 g/dL (calc) (ref 1.9–3.7)
Glucose, Bld: 91 mg/dL (ref 65–99)
Potassium: 4.3 mmol/L (ref 3.5–5.3)
Sodium: 139 mmol/L (ref 135–146)
Total Bilirubin: 0.4 mg/dL (ref 0.2–1.2)
Total Protein: 6.5 g/dL (ref 6.1–8.1)

## 2020-12-29 LAB — C-REACTIVE PROTEIN: CRP: 0.2 mg/L (ref <8.0)

## 2020-12-29 LAB — LIPASE: Lipase: 51 U/L (ref 7–60)

## 2020-12-29 LAB — AMYLASE: Amylase: 44 U/L (ref 21–101)

## 2020-12-31 ENCOUNTER — Encounter: Payer: Self-pay | Admitting: Medical-Surgical

## 2020-12-31 DIAGNOSIS — R1010 Upper abdominal pain, unspecified: Secondary | ICD-10-CM

## 2021-01-01 DIAGNOSIS — R1010 Upper abdominal pain, unspecified: Secondary | ICD-10-CM | POA: Diagnosis not present

## 2021-01-02 LAB — H. PYLORI BREATH TEST: H. pylori Breath Test: NOT DETECTED

## 2021-01-04 ENCOUNTER — Other Ambulatory Visit: Payer: BC Managed Care – PPO

## 2021-01-07 ENCOUNTER — Encounter: Payer: Self-pay | Admitting: Medical-Surgical

## 2021-01-09 ENCOUNTER — Other Ambulatory Visit: Payer: Self-pay

## 2021-01-09 ENCOUNTER — Ambulatory Visit (INDEPENDENT_AMBULATORY_CARE_PROVIDER_SITE_OTHER): Payer: BC Managed Care – PPO

## 2021-01-09 DIAGNOSIS — R1010 Upper abdominal pain, unspecified: Secondary | ICD-10-CM | POA: Diagnosis not present

## 2021-01-09 DIAGNOSIS — R109 Unspecified abdominal pain: Secondary | ICD-10-CM | POA: Diagnosis not present

## 2021-01-11 ENCOUNTER — Encounter: Payer: Self-pay | Admitting: Medical-Surgical

## 2021-01-14 ENCOUNTER — Other Ambulatory Visit: Payer: Self-pay | Admitting: Medical-Surgical

## 2021-01-14 DIAGNOSIS — R1010 Upper abdominal pain, unspecified: Secondary | ICD-10-CM

## 2021-01-14 DIAGNOSIS — R1013 Epigastric pain: Secondary | ICD-10-CM

## 2021-01-17 ENCOUNTER — Ambulatory Visit (INDEPENDENT_AMBULATORY_CARE_PROVIDER_SITE_OTHER): Payer: BC Managed Care – PPO

## 2021-01-17 ENCOUNTER — Other Ambulatory Visit: Payer: Self-pay

## 2021-01-17 DIAGNOSIS — R1010 Upper abdominal pain, unspecified: Secondary | ICD-10-CM | POA: Diagnosis not present

## 2021-01-17 DIAGNOSIS — R1013 Epigastric pain: Secondary | ICD-10-CM | POA: Diagnosis not present

## 2021-01-18 ENCOUNTER — Encounter: Payer: Self-pay | Admitting: Medical-Surgical

## 2021-01-19 ENCOUNTER — Encounter: Payer: Self-pay | Admitting: Medical-Surgical

## 2021-02-20 DIAGNOSIS — R1013 Epigastric pain: Secondary | ICD-10-CM | POA: Diagnosis not present

## 2021-02-21 DIAGNOSIS — K2100 Gastro-esophageal reflux disease with esophagitis, without bleeding: Secondary | ICD-10-CM | POA: Diagnosis not present

## 2021-02-21 DIAGNOSIS — K3189 Other diseases of stomach and duodenum: Secondary | ICD-10-CM | POA: Diagnosis not present

## 2021-02-21 DIAGNOSIS — K2289 Other specified disease of esophagus: Secondary | ICD-10-CM | POA: Diagnosis not present

## 2021-02-21 DIAGNOSIS — R1013 Epigastric pain: Secondary | ICD-10-CM | POA: Diagnosis not present

## 2021-02-21 DIAGNOSIS — K2950 Unspecified chronic gastritis without bleeding: Secondary | ICD-10-CM | POA: Diagnosis not present

## 2021-03-18 DIAGNOSIS — R1013 Epigastric pain: Secondary | ICD-10-CM | POA: Diagnosis not present

## 2021-04-09 DIAGNOSIS — R109 Unspecified abdominal pain: Secondary | ICD-10-CM | POA: Diagnosis not present

## 2021-04-09 DIAGNOSIS — E538 Deficiency of other specified B group vitamins: Secondary | ICD-10-CM | POA: Diagnosis not present

## 2021-04-09 DIAGNOSIS — Z131 Encounter for screening for diabetes mellitus: Secondary | ICD-10-CM | POA: Diagnosis not present

## 2021-04-09 DIAGNOSIS — F419 Anxiety disorder, unspecified: Secondary | ICD-10-CM | POA: Diagnosis not present

## 2021-04-09 DIAGNOSIS — Z1321 Encounter for screening for nutritional disorder: Secondary | ICD-10-CM | POA: Diagnosis not present

## 2021-04-09 DIAGNOSIS — R5383 Other fatigue: Secondary | ICD-10-CM | POA: Diagnosis not present

## 2021-04-09 DIAGNOSIS — E559 Vitamin D deficiency, unspecified: Secondary | ICD-10-CM | POA: Diagnosis not present

## 2021-04-09 DIAGNOSIS — Z1329 Encounter for screening for other suspected endocrine disorder: Secondary | ICD-10-CM | POA: Diagnosis not present

## 2021-04-16 ENCOUNTER — Encounter: Payer: Self-pay | Admitting: Advanced Practice Midwife

## 2021-04-16 ENCOUNTER — Ambulatory Visit (INDEPENDENT_AMBULATORY_CARE_PROVIDER_SITE_OTHER): Payer: BC Managed Care – PPO | Admitting: Advanced Practice Midwife

## 2021-04-16 ENCOUNTER — Other Ambulatory Visit: Payer: Self-pay

## 2021-04-16 ENCOUNTER — Other Ambulatory Visit (HOSPITAL_COMMUNITY)
Admission: RE | Admit: 2021-04-16 | Discharge: 2021-04-16 | Disposition: A | Discharge status: Home / Self Care | Payer: BC Managed Care – PPO | Source: Ambulatory Visit | Attending: Advanced Practice Midwife | Admitting: Advanced Practice Midwife

## 2021-04-16 VITALS — BP 107/72 | HR 74 | Resp 16 | Ht 68.0 in | Wt 127.0 lb

## 2021-04-16 DIAGNOSIS — B370 Candidal stomatitis: Secondary | ICD-10-CM

## 2021-04-16 DIAGNOSIS — Z862 Personal history of diseases of the blood and blood-forming organs and certain disorders involving the immune mechanism: Secondary | ICD-10-CM | POA: Diagnosis not present

## 2021-04-16 DIAGNOSIS — Z01419 Encounter for gynecological examination (general) (routine) without abnormal findings: Secondary | ICD-10-CM

## 2021-04-16 DIAGNOSIS — B373 Candidiasis of vulva and vagina: Secondary | ICD-10-CM

## 2021-04-16 DIAGNOSIS — B3731 Acute candidiasis of vulva and vagina: Secondary | ICD-10-CM

## 2021-04-16 MED ORDER — FLUCONAZOLE 100 MG PO TABS: 100.0000 mg | ORAL_TABLET | Freq: Every day | ORAL | 2 refills | Status: AC

## 2021-04-16 NOTE — Progress Notes (Signed)
Subjective:     Quenisha Lovins is a 35 y.o. female here at [facility] for a routine exam.  Current complaints: recurrent vaginal and oral candidiasis, fatigue, abdominal pain.  Pt has seen PCP and specialists. All symptoms started after 3 courses of abx for UTI in early 2021.  Pt now seeing functional medicine/complimentary medicine for testing/treatment. She has followed autoimmune diet off and on x 1 year and strictly x 4-5 months with some improvement.  She uses boric acid PRN for vaginal yeast symptoms and has stopped x 1 month with minimal recurrent symptoms. She is becoming down about her lack of progress and lack of diagnosis for her symptoms.  She reports frustration and difficulty with following a strict diet and making all of her own food.  She has found some support online with other people struggling/finding solutions for autoimmune disorders.  Personal health questionnaire reviewed: yes.  Do you have a primary care provider? yes Do you feel safe at home? yes  Flowsheet Row Office Visit from 10/05/2020 in [facility]  PHQ-2 Total Score 2       Health Maintenance Due  Topic Date Due   COVID-19 Vaccine (1) Never done   Pneumococcal Vaccine 49-26 Years old (1 - PCV) Never done   HIV Screening  Never done   Hepatitis C Screening  Never done   INFLUENZA VACCINE  04/08/2021     Risk factors for chronic health problems: Smoking: No Alchohol/how much: None Pt BMI: Body mass index is 19.31 kg/m.   Gynecologic History Patient's last menstrual period was 03/05/2021. Contraception: abstinence Last Pap: 2021. Results were: normal Last mammogram: n/a.   Obstetric History OB History  Gravida Para Term Preterm AB Living  0 0 0 0 0 0  SAB IAB Ectopic Multiple Live Births  0 0 0 0 0     The following portions of the patient's history were reviewed and updated as appropriate: allergies, current medications, past family history, past medical  history, past social history, past surgical history, and problem list.  Review of Systems Pertinent items noted in HPI and remainder of comprehensive ROS otherwise negative.    Objective:   BP 107/72   Pulse 74   Resp 16   Ht 5\' 8"  (1.727 m)   Wt 127 lb (57.6 kg)   LMP 03/05/2021   BMI 19.31 kg/m  VS reviewed, nursing note reviewed,  Constitutional: well developed, well nourished, no distress HEENT: normocephalic CV: normal rate Pulm/chest wall: normal effort Breast Exam:  Deferred with low risks and shared decision making, discussed recommendation to start mammogram between 40-50 yo/ Abdomen: soft Neuro: alert and oriented x 3 Skin: warm, dry Psych: affect normal Pelvic exam: Performed: Cervix pink, visually closed, without lesion, scant white creamy discharge, vaginal walls and external genitalia normal Bimanual exam: Cervix 0/long/high, firm, anterior, neg CMT, uterus nontender, nonenlarged, adnexa without tenderness, enlargement, or mass       Assessment/Plan:   1. Well woman exam --Pt prefers annual Pap. Discussed reasons for Q 3-5 year recommendation with low risks of cervical cancer at appointment last year.  Reviewed again and pt desires Pap annually as long as insurance will cover.  --Menses regular, with the exception of this month when she has not yet had menses.  No risk of pregnancy. Recent stress and changes to diet/complimentary therapy likely reasons for irregular menses.  Pt to notify office if irregular menses persist. - Cytology - PAP( [place])  2. Recurrent candidiasis of vagina --Improved symptoms this month, but use of boric acid for symptoms regularly until 1 month ago --Will prescribe Diflucan 100 mg daily x 14 days as this reportedly improved pt vaginal/oral/systemic symptoms last year.  Pt to discuss medication with her complimentary medicine providers and use PRN.  3. Candida, oral --Symptoms improved now but still recur at times  4. History  of autoimmune disorder --No formal dx. Pt working with functional/complimentary medicine for testing/treatment. 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  --Pt to continue current course, encouraged pt who is feeling frustrated/down about lack of progress. --May want to see rheumatology for further evaluation     Follow up in: 1  year  or as needed.   Sharen Counter, CNM 10:03 AM

## 2021-04-16 NOTE — Progress Notes (Signed)
Last pap 07/03/20- negative

## 2021-04-17 LAB — CYTOLOGY - PAP
Comment: NEGATIVE
Diagnosis: NEGATIVE
High risk HPV: NEGATIVE

## 2021-05-07 DIAGNOSIS — R5383 Other fatigue: Secondary | ICD-10-CM | POA: Diagnosis not present

## 2021-05-07 DIAGNOSIS — R109 Unspecified abdominal pain: Secondary | ICD-10-CM | POA: Diagnosis not present

## 2021-05-07 DIAGNOSIS — Z1321 Encounter for screening for nutritional disorder: Secondary | ICD-10-CM | POA: Diagnosis not present

## 2021-05-07 DIAGNOSIS — F419 Anxiety disorder, unspecified: Secondary | ICD-10-CM | POA: Diagnosis not present

## 2022-06-20 IMAGING — DX DG RIBS W/ CHEST 3+V*R*
3 series · 3 of 3 positions shown · non-contrast
Comparison: None.

CLINICAL DATA: Right-sided rib pain following heavy lifting 2
months ago, initial encounter

EXAM:
RIGHT RIBS AND CHEST - 3+ VIEW

[chest pa]
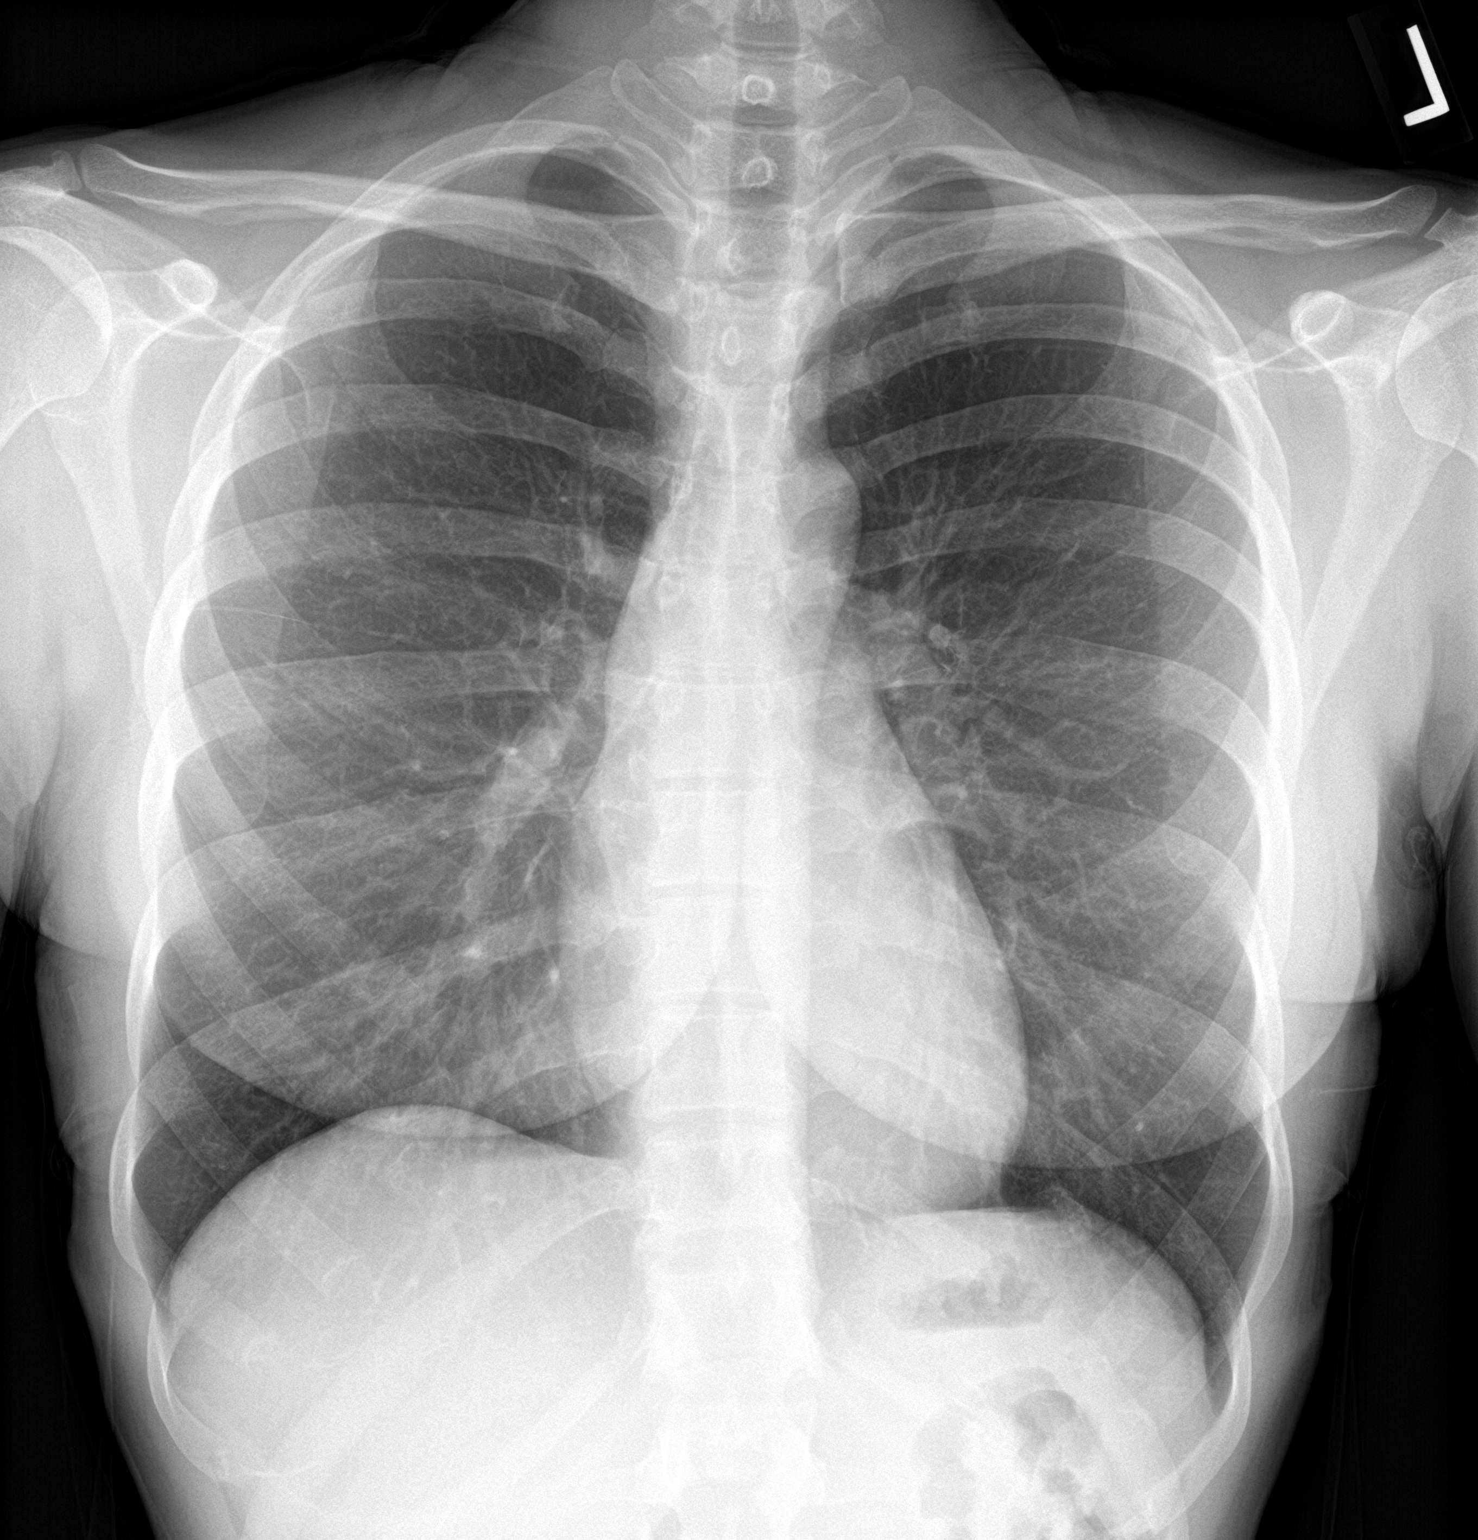

[rib pa]
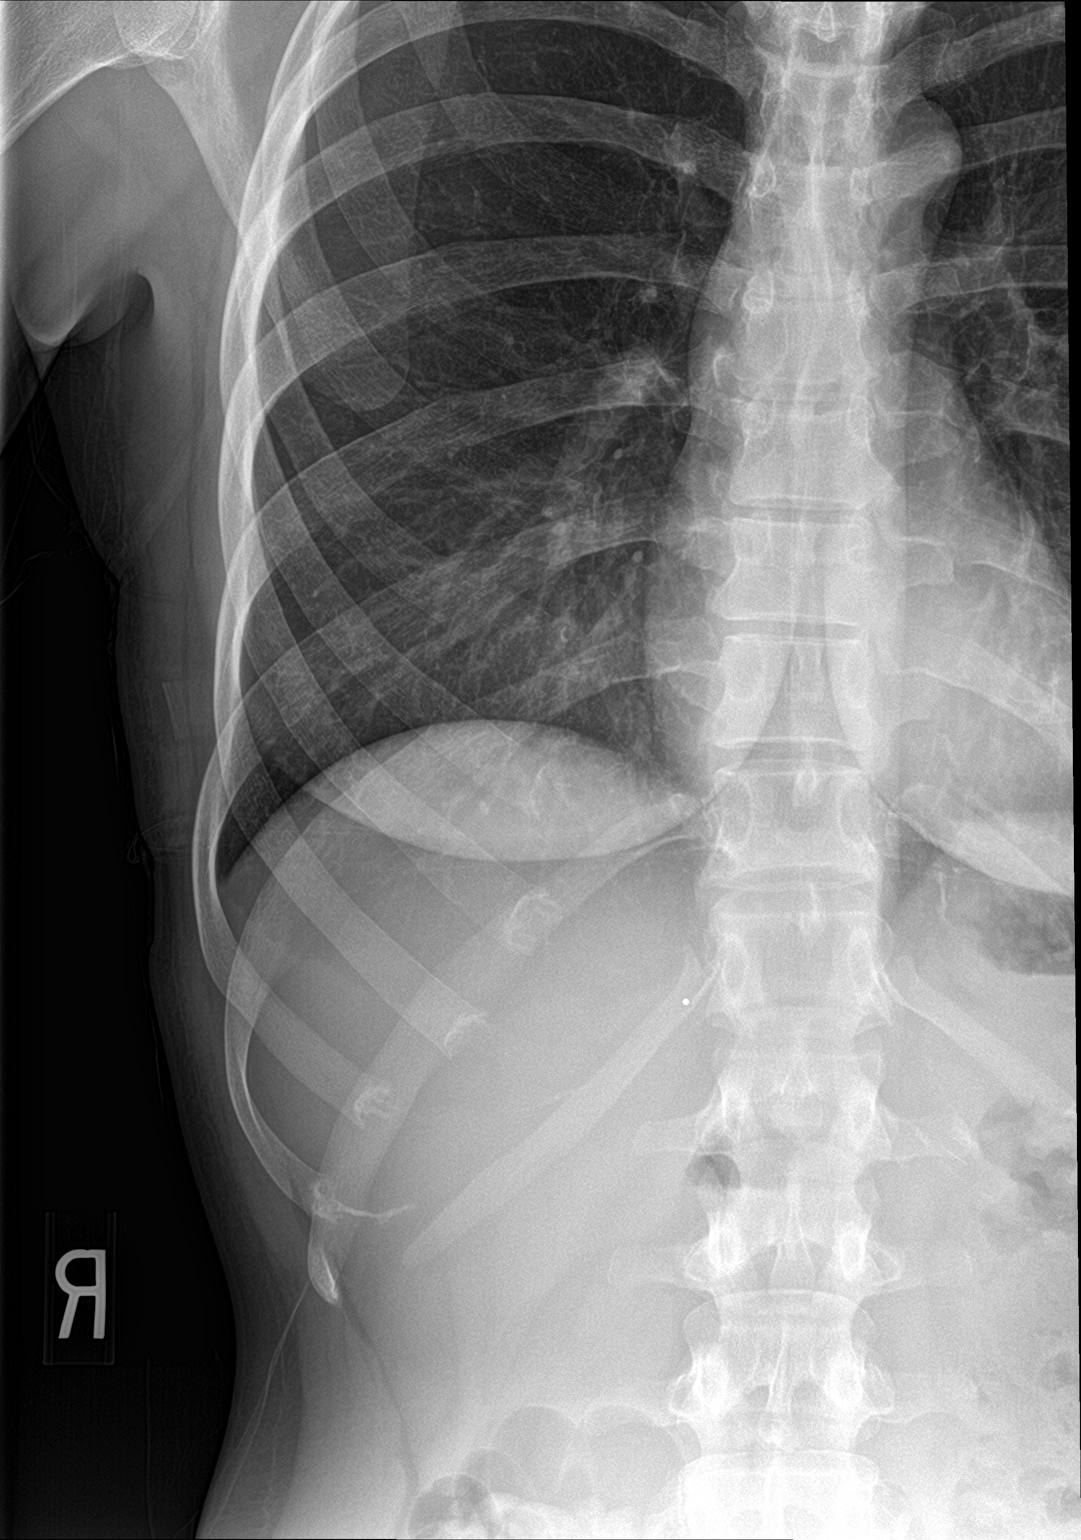

[rib pa obl]
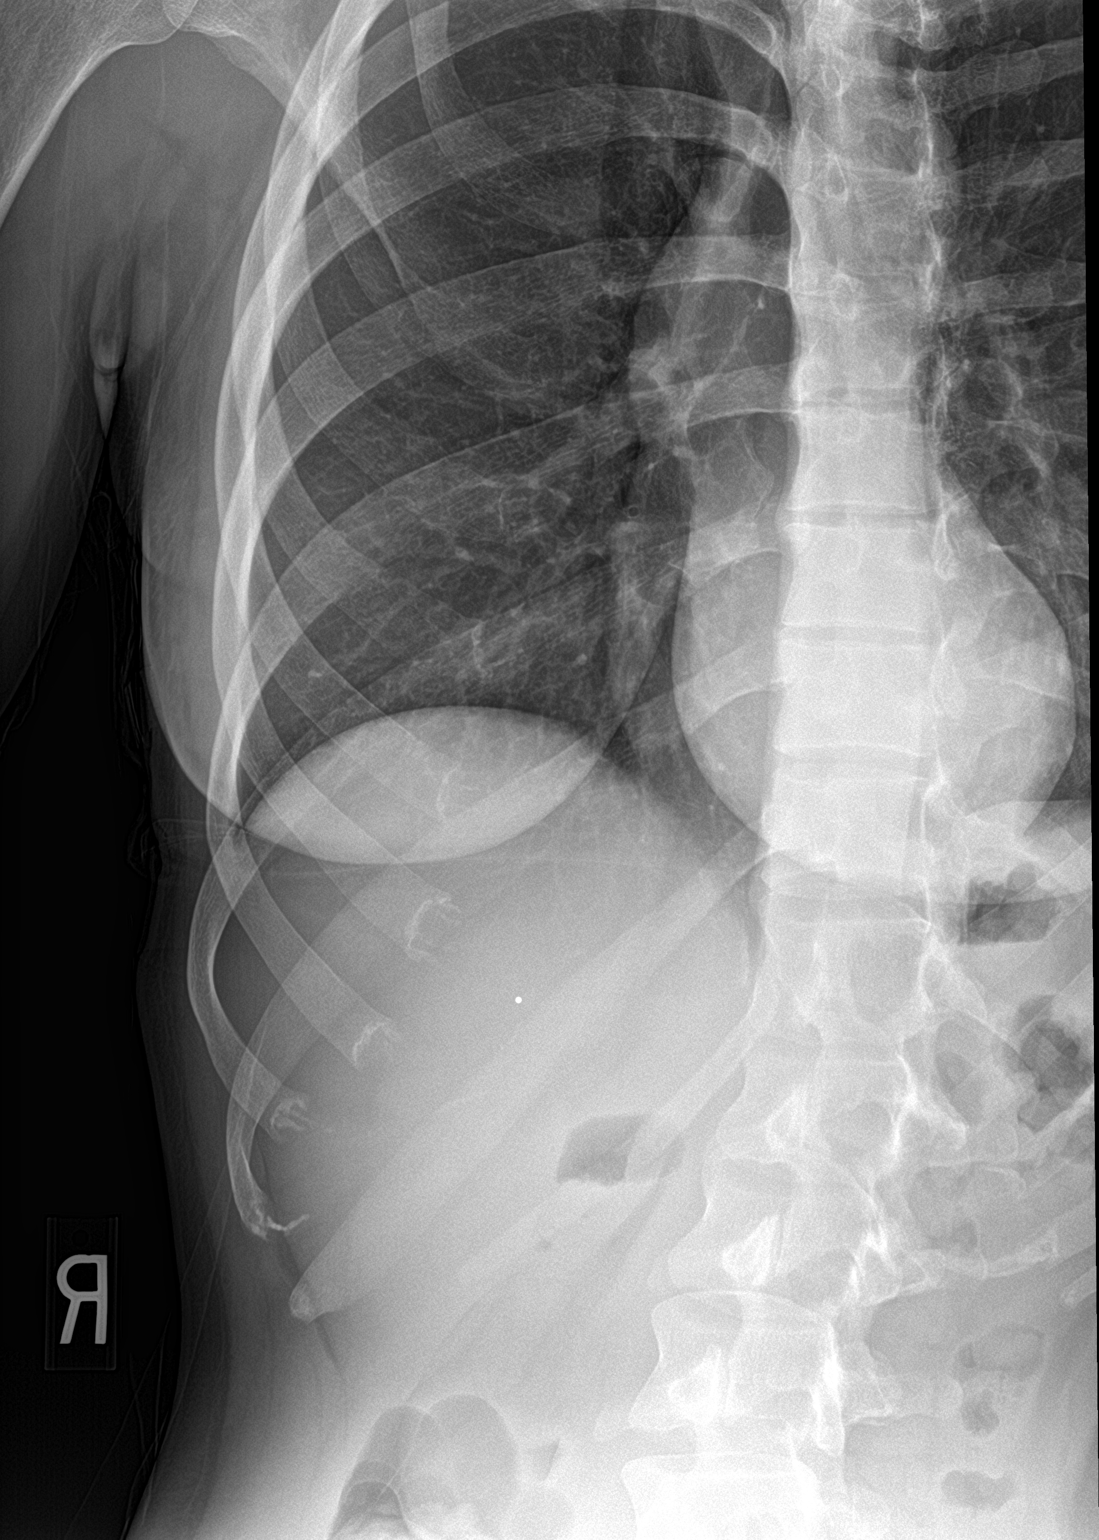

[3 of 3 positions shown; findings below may reference images not displayed]

FINDINGS: Cardiac shadow is within normal limits. The lungs are clear
bilaterally. No pneumothorax or effusion is seen. No rib fractures
are noted.
IMPRESSION: No acute rib fracture is noted.

## 2022-07-08 ENCOUNTER — Encounter: Payer: Self-pay | Admitting: Obstetrics and Gynecology

## 2022-07-08 ENCOUNTER — Other Ambulatory Visit (HOSPITAL_COMMUNITY)
Admission: RE | Admit: 2022-07-08 | Discharge: 2022-07-08 | Disposition: A | Discharge status: Home / Self Care | Payer: BC Managed Care – PPO | Source: Ambulatory Visit | Attending: Obstetrics and Gynecology | Admitting: Obstetrics and Gynecology

## 2022-07-08 ENCOUNTER — Ambulatory Visit (INDEPENDENT_AMBULATORY_CARE_PROVIDER_SITE_OTHER): Payer: BC Managed Care – PPO | Admitting: Obstetrics and Gynecology

## 2022-07-08 VITALS — BP 106/75 | HR 68 | Resp 16 | Ht 68.0 in | Wt 129.0 lb

## 2022-07-08 DIAGNOSIS — Z01419 Encounter for gynecological examination (general) (routine) without abnormal findings: Secondary | ICD-10-CM | POA: Insufficient documentation

## 2022-07-08 NOTE — Progress Notes (Signed)
GYNECOLOGY ANNUAL PREVENTATIVE CARE ENCOUNTER NOTE  History:     Fayrene FearingKay Prowse is a 36 y.o. G0P0000 female here for a routine annual gynecologic exam.  Current complaints: None.   Denies abnormal vaginal bleeding, discharge, pelvic pain, problems with intercourse or other gynecologic concerns.    Gynecologic History Patient's last menstrual period was 06/28/2022. Contraception: none Last Pap: 2022. Result was normal with negative HPV  Obstetric History OB History  Gravida Para Term Preterm AB Living  0 0 0 0 0 0  SAB IAB Ectopic Multiple Live Births  0 0 0 0 0    Past Medical History:  Diagnosis Date   Anxiety    Depression    Mold exposure     Past Surgical History:  Procedure Laterality Date   ACHILLES TENDON REPAIR     BREAST FIBROADENOMA SURGERY Left 2010   VULVECTOMY  2009   WISDOM TOOTH EXTRACTION      Current Outpatient Medications on File Prior to Visit  Medication Sig Dispense Refill   Cholecalciferol (VITAMIN D) 125 MCG (5000 UT) CAPS Take 1 capsule by mouth daily.     itraconazole (SPORANOX) 100 MG capsule Take 100 mg by mouth 2 (two) times daily.     ivermectin (STROMECTOL) 3 MG TABS tablet SMARTSIG:4 Tablet(s) By Mouth Twice a Week     Multiple Vitamins-Minerals (MULTIPLE VITAMINS/WOMENS) tablet Take 1 tablet by mouth daily.     Omega-3 Fatty Acids (FISH OIL PO) Take 1 capsule by mouth daily.     progesterone 200 MG SUPP      thyroid (NP THYROID) 30 MG tablet      No current facility-administered medications on file prior to visit.    No Active Allergies  Social History:  reports that she has never smoked. She has never used smokeless tobacco. She reports that she does not drink alcohol and does not use drugs.  Family History  Problem Relation Age of Onset   Depression Mother    Fibroids Mother    Breast cancer Mother    Hypertension Father    Diabetes Father    Ovarian cysts Sister    Thyroid cancer Maternal Grandfather    Heart disease  Maternal Grandfather    Diabetes Paternal Grandmother    Hypertension Paternal Grandmother    COPD Paternal Grandfather     The following portions of the patient's history were reviewed and updated as appropriate: allergies, current medications, past family history, past medical history, past social history, past surgical history and problem list.  Review of Systems Pertinent items noted in HPI and remainder of comprehensive ROS otherwise negative.  Physical Exam:  BP 106/75   Pulse 68   Resp 16   Ht 5\' 8"  (1.727 m)   Wt 129 lb (58.5 kg)   LMP 06/28/2022   BMI 19.61 kg/m  CONSTITUTIONAL: Well-developed, well-nourished female in no acute distress.  HENT:  Normocephalic, atraumatic, External right and left ear normal.  EYES: Conjunctivae and EOM are normal. Pupils are equal, round, and reactive to light. No scleral icterus.  NECK: Normal range of motion, supple, no masses.  Normal thyroid.  SKIN: Skin is warm and dry. No rash noted. Not diaphoretic. No erythema. No pallor. MUSCULOSKELETAL: Normal range of motion. No tenderness.  No cyanosis, clubbing, or edema. NEUROLOGIC: Alert and oriented to person, place, and time. Normal reflexes, muscle tone coordination.  PSYCHIATRIC: Normal mood and affect. Normal behavior. Normal judgment and thought content. CARDIOVASCULAR: Normal heart rate noted, regular  rhythm RESPIRATORY: Clear to auscultation bilaterally. Effort and breath sounds normal, no problems with respiration noted. BREASTS: Symmetric in size. No masses, tenderness, skin changes, nipple drainage, or lymphadenopathy bilaterally. Fibrocysts noted. Performed in the presence of a chaperone. ABDOMEN: Soft, no distention noted.  No tenderness, rebound or guarding.  PELVIC: Normal appearing external genitalia and urethral meatus; normal appearing vaginal mucosa and cervix.  No abnormal vaginal discharge noted.  Pap smear obtained.  Normal uterine size, no other palpable masses, no uterine  or adnexal tenderness.  Performed in the presence of a chaperone.   Assessment and Plan:    1. Well woman exam with routine gynecological exam  - Cytology - PAP( [place])  - She requests yearly pap's  - Functional medicine Dr. Collects annual labs.   Will follow up results of pap smear and manage accordingly. Routine preventative health maintenance measures emphasized. Please refer to After Visit Summary for other counseling recommendations.   Ahmani Daoud, Artist Pais, [place] for [employer], [place]

## 2022-07-08 NOTE — Progress Notes (Signed)
Pt does want a thorough breast exam.  Does want a pap smear.  Insurance will cover.  Pt declines FLU vaccine

## 2022-07-10 LAB — CYTOLOGY - PAP
Comment: NEGATIVE
Diagnosis: NEGATIVE
High risk HPV: NEGATIVE

## 2023-05-10 IMAGING — CT CT ABD-PELV W/O CM
2 of 4 series · 15 of 46 positions shown, 17 images · non-contrast
Comparison: Abdominal ultrasound 01/09/2021

CLINICAL DATA: Pt says she has been having epigastric and mid abd
pain for about 1 year, she feels like it started shortly after
lifting heavy rocks.

EXAM:
CT ABDOMEN AND PELVIS WITHOUT CONTRAST
TECHNIQUE: Multidetector CT imaging of the abdomen and pelvis was performed
following the standard protocol without IV contrast.

[Series 2: axial st · axial · 0.63mm/px · z∈[-483,-48]mm · 12 of 97 slices shown, 14 images]
[im 5/97  soft-tissue]
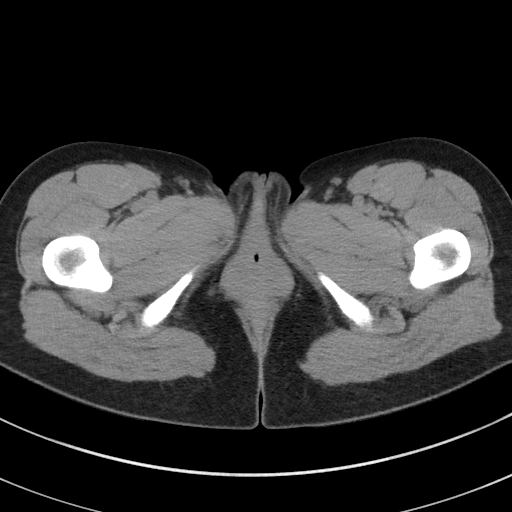
[im 5/97  bone]
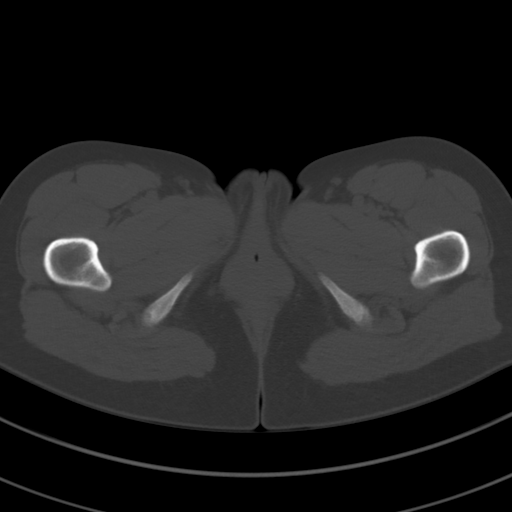
[im 13/97  soft-tissue]
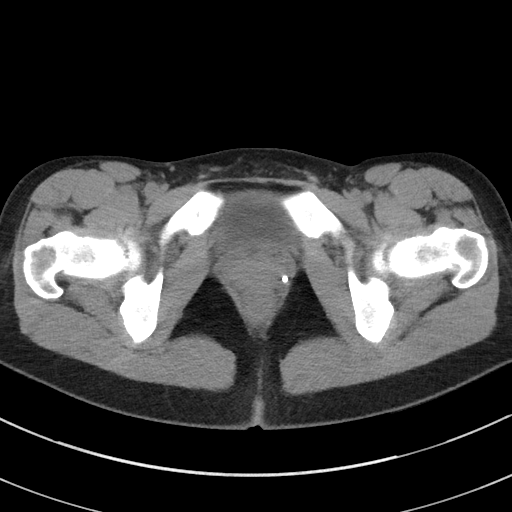
[im 21/97  soft-tissue]
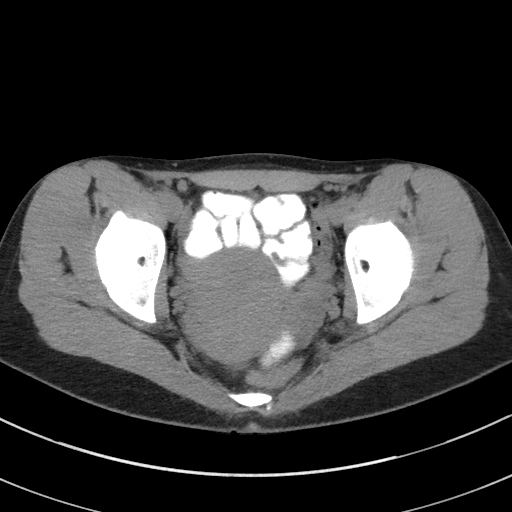
[im 30/97  soft-tissue]
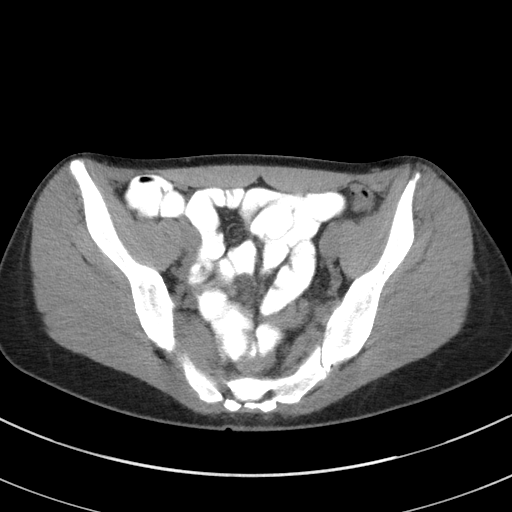
[im 38/97  soft-tissue]
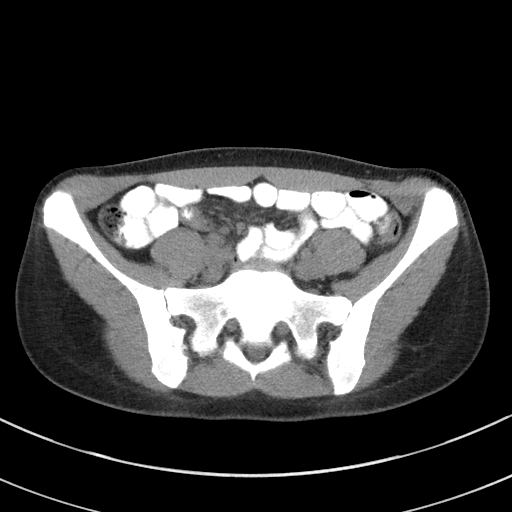
[im 46/97  soft-tissue]
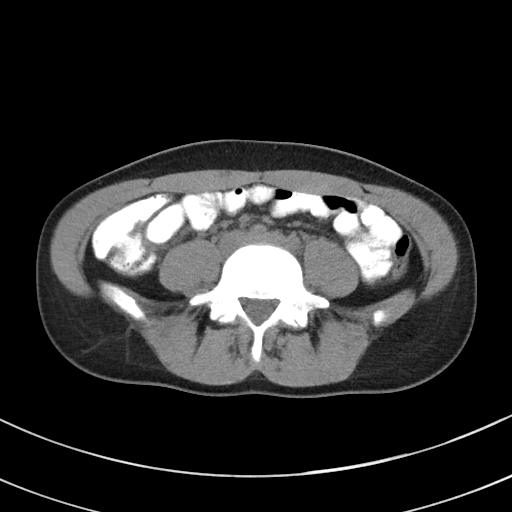
[im 51/97  soft-tissue]
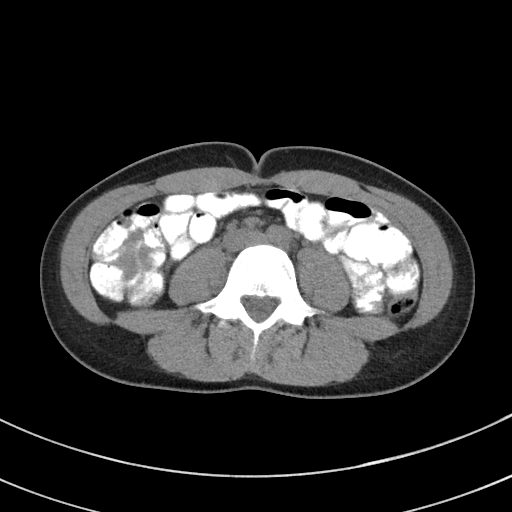
[im 59/97  soft-tissue]
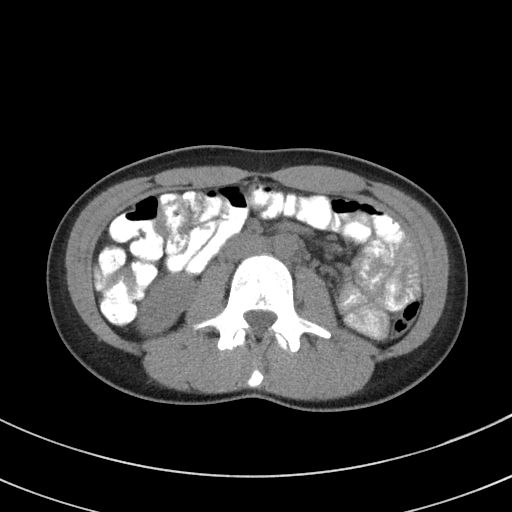
[im 67/97  soft-tissue]
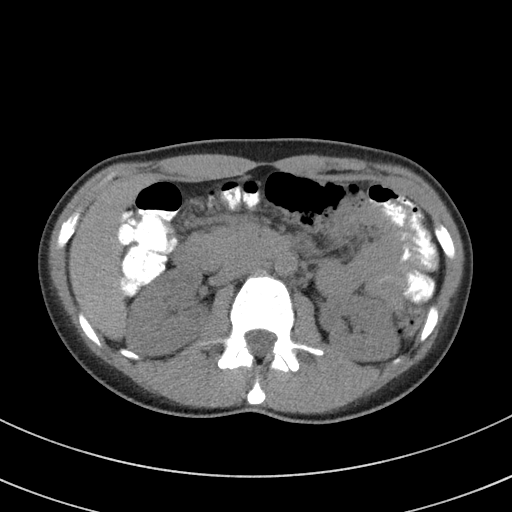
[im 67/97  bone]
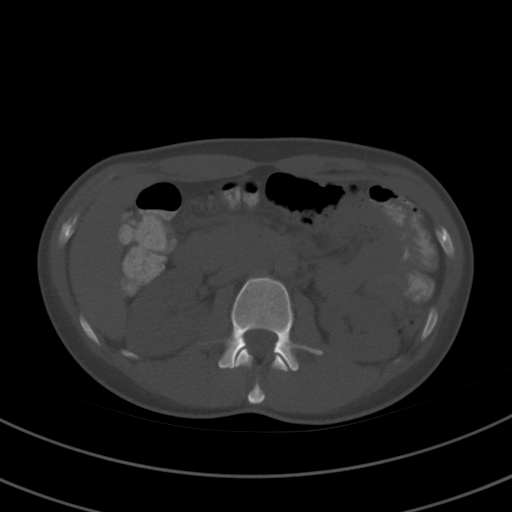
[im 76/97  soft-tissue]
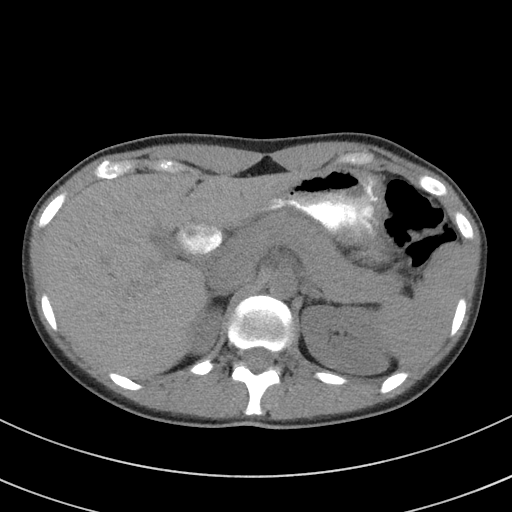
[im 84/97  soft-tissue]
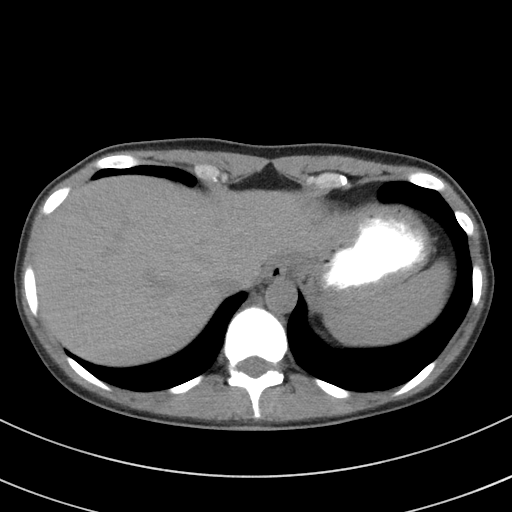
[im 92/97  soft-tissue]
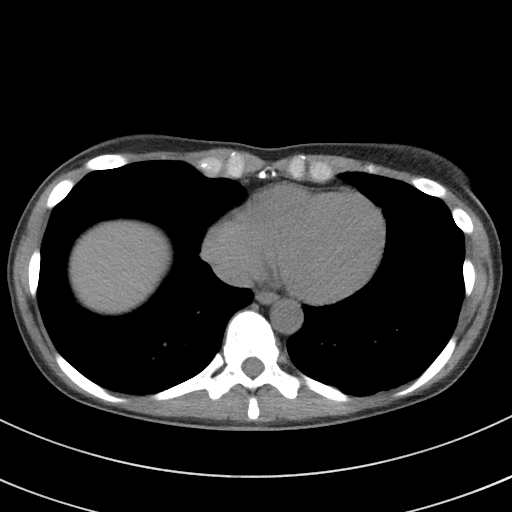

[Series 5: coronal st · coronal · 0.52mm/px · 3 of 62 slices shown]
[im 21/62  soft-tissue]
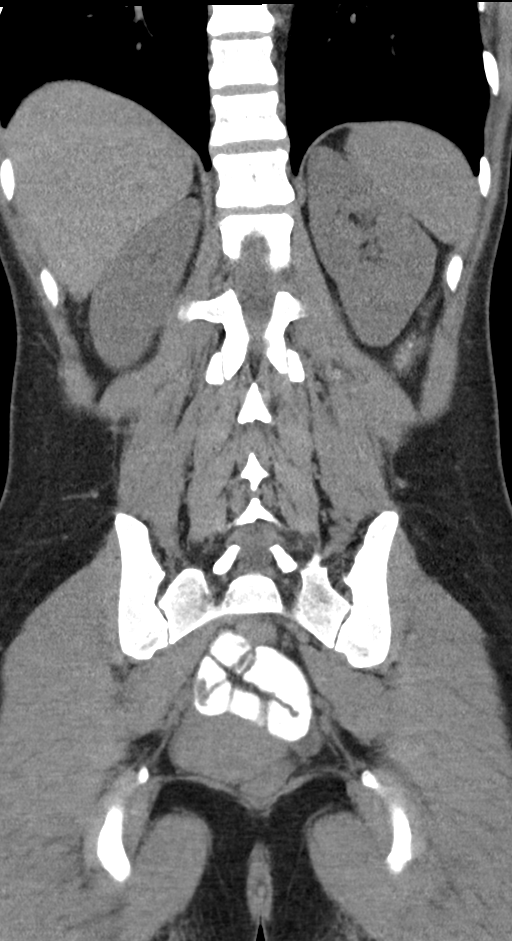
[im 28/62  soft-tissue]
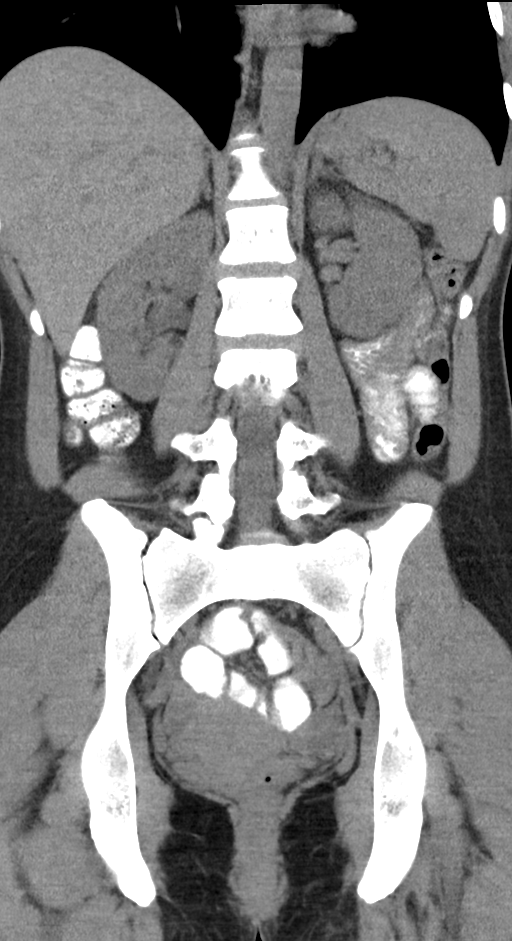
[im 34/62  soft-tissue]
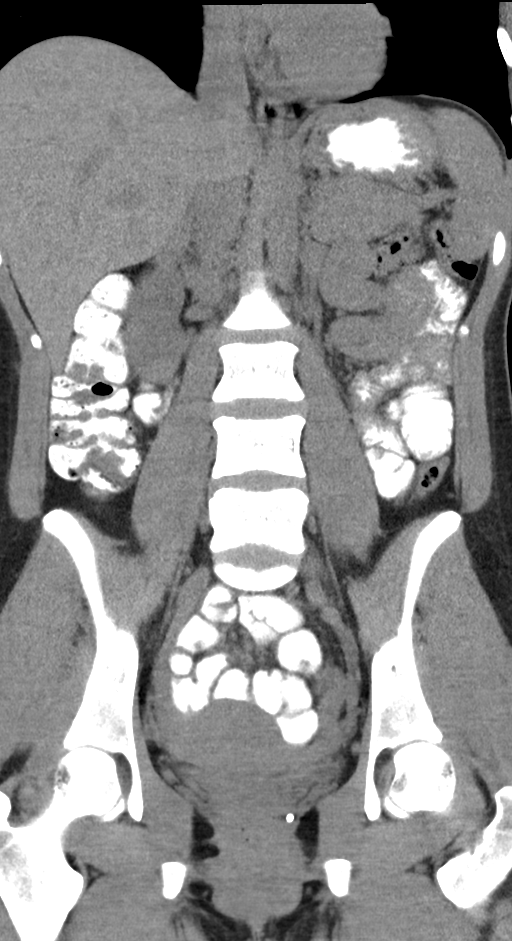

[15 of 46 positions shown; findings below may reference images not displayed]

FINDINGS: Lower chest: No acute abnormality.

Evaluation of the abdominal viscera limited by the lack of IV
contrast.

Hepatobiliary: No focal liver abnormality is seen. Normal appearance
of the gallbladder.

Pancreas: Unremarkable. No surrounding inflammatory changes.

Spleen: Normal in size without focal abnormality.

Adrenals/Urinary Tract: Adrenal glands are unremarkable. Kidneys are
normal, without renal calculi, mass lesion, or hydronephrosis.
Bladder is unremarkable.

Stomach/Bowel: Stomach is within normal limits. No evidence of bowel
wall thickening, distention, or inflammatory changes.

Vascular/Lymphatic: No enlarged abdominal or pelvic lymph nodes.

Reproductive: Uterus and bilateral adnexa are unremarkable.

Other: No abdominal wall hernia or abnormality. No abdominopelvic
ascites.

Musculoskeletal: No acute or significant osseous findings.
IMPRESSION: 1.  No evidence of abdominal wall hernia.

2. No abnormality on a noncontrast exam to explain the patient's
abdominal pain.
